# Patient Record
Sex: Female | Born: 1950 | Race: White | Hispanic: No | Marital: Married | Smoking: Never smoker
Health system: Southern US, Community
[De-identification: ages and names within clinical notes are randomized; demographics above are authoritative.]

## PROBLEM LIST (undated history)

## (undated) DIAGNOSIS — N840 Polyp of corpus uteri: Secondary | ICD-10-CM

## (undated) DIAGNOSIS — M8580 Other specified disorders of bone density and structure, unspecified site: Secondary | ICD-10-CM

## (undated) DIAGNOSIS — G43909 Migraine, unspecified, not intractable, without status migrainosus: Secondary | ICD-10-CM

## (undated) DIAGNOSIS — M419 Scoliosis, unspecified: Secondary | ICD-10-CM

## (undated) DIAGNOSIS — C4492 Squamous cell carcinoma of skin, unspecified: Secondary | ICD-10-CM

## (undated) DIAGNOSIS — J302 Other seasonal allergic rhinitis: Secondary | ICD-10-CM

## (undated) DIAGNOSIS — B019 Varicella without complication: Secondary | ICD-10-CM

## (undated) DIAGNOSIS — L570 Actinic keratosis: Secondary | ICD-10-CM

## (undated) DIAGNOSIS — K219 Gastro-esophageal reflux disease without esophagitis: Secondary | ICD-10-CM

## (undated) DIAGNOSIS — T7840XA Allergy, unspecified, initial encounter: Secondary | ICD-10-CM

## (undated) DIAGNOSIS — M1990 Unspecified osteoarthritis, unspecified site: Secondary | ICD-10-CM

## (undated) HISTORY — DX: Actinic keratosis: L57.0

## (undated) HISTORY — DX: Migraine, unspecified, not intractable, without status migrainosus: G43.909

## (undated) HISTORY — PX: COLONOSCOPY: SHX174

## (undated) HISTORY — DX: Unspecified osteoarthritis, unspecified site: M19.90

## (undated) HISTORY — DX: Gastro-esophageal reflux disease without esophagitis: K21.9

## (undated) HISTORY — DX: Other specified disorders of bone density and structure, unspecified site: M85.80

## (undated) HISTORY — DX: Varicella without complication: B01.9

## (undated) HISTORY — DX: Scoliosis, unspecified: M41.9

## (undated) HISTORY — DX: Allergy, unspecified, initial encounter: T78.40XA

## (undated) HISTORY — PX: TOTAL HIP ARTHROPLASTY: SHX124

## (undated) HISTORY — DX: Polyp of corpus uteri: N84.0

## (undated) HISTORY — DX: Other seasonal allergic rhinitis: J30.2

## (undated) HISTORY — DX: Squamous cell carcinoma of skin, unspecified: C44.92

## (undated) HISTORY — PX: OTHER SURGICAL HISTORY: SHX169

---

## 1987-10-21 HISTORY — PX: TUBAL LIGATION: SHX77

## 1999-01-29 ENCOUNTER — Other Ambulatory Visit: Admission: RE | Admit: 1999-01-29 | Discharge: 1999-01-29 | Payer: Self-pay | Admitting: Gynecology

## 2000-02-24 ENCOUNTER — Other Ambulatory Visit: Admission: RE | Admit: 2000-02-24 | Discharge: 2000-02-24 | Payer: Self-pay | Admitting: Gynecology

## 2000-07-21 ENCOUNTER — Ambulatory Visit (HOSPITAL_COMMUNITY): Admission: RE | Admit: 2000-07-21 | Discharge: 2000-07-21 | Payer: Self-pay | Admitting: Gynecology

## 2000-07-21 ENCOUNTER — Encounter: Payer: Self-pay | Admitting: Gynecology

## 2001-03-08 ENCOUNTER — Other Ambulatory Visit: Admission: RE | Admit: 2001-03-08 | Discharge: 2001-03-08 | Payer: Self-pay | Admitting: Gynecology

## 2001-07-22 ENCOUNTER — Encounter: Payer: Self-pay | Admitting: Gynecology

## 2001-07-22 ENCOUNTER — Ambulatory Visit (HOSPITAL_COMMUNITY): Admission: RE | Admit: 2001-07-22 | Discharge: 2001-07-22 | Payer: Self-pay | Admitting: Gynecology

## 2001-12-31 ENCOUNTER — Ambulatory Visit (HOSPITAL_COMMUNITY): Admission: RE | Admit: 2001-12-31 | Discharge: 2001-12-31 | Payer: Self-pay | Admitting: Gastroenterology

## 2002-05-02 ENCOUNTER — Other Ambulatory Visit: Admission: RE | Admit: 2002-05-02 | Discharge: 2002-05-02 | Payer: Self-pay | Admitting: Gynecology

## 2002-08-02 ENCOUNTER — Ambulatory Visit (HOSPITAL_COMMUNITY): Admission: RE | Admit: 2002-08-02 | Discharge: 2002-08-02 | Payer: Self-pay | Admitting: Gynecology

## 2002-08-02 ENCOUNTER — Encounter: Payer: Self-pay | Admitting: Gynecology

## 2003-06-01 ENCOUNTER — Other Ambulatory Visit: Admission: RE | Admit: 2003-06-01 | Discharge: 2003-06-01 | Payer: Self-pay | Admitting: Gynecology

## 2003-08-08 ENCOUNTER — Ambulatory Visit (HOSPITAL_COMMUNITY): Admission: RE | Admit: 2003-08-08 | Discharge: 2003-08-08 | Payer: Self-pay | Admitting: Gynecology

## 2003-08-08 ENCOUNTER — Encounter: Payer: Self-pay | Admitting: Gynecology

## 2004-08-08 ENCOUNTER — Ambulatory Visit (HOSPITAL_COMMUNITY): Admission: RE | Admit: 2004-08-08 | Discharge: 2004-08-08 | Payer: Self-pay | Admitting: Gynecology

## 2004-08-12 ENCOUNTER — Other Ambulatory Visit: Admission: RE | Admit: 2004-08-12 | Discharge: 2004-08-12 | Payer: Self-pay | Admitting: Gynecology

## 2005-08-11 ENCOUNTER — Ambulatory Visit (HOSPITAL_COMMUNITY): Admission: RE | Admit: 2005-08-11 | Discharge: 2005-08-11 | Payer: Self-pay | Admitting: Gynecology

## 2005-08-25 ENCOUNTER — Other Ambulatory Visit: Admission: RE | Admit: 2005-08-25 | Discharge: 2005-08-25 | Payer: Self-pay | Admitting: Gynecology

## 2006-02-10 ENCOUNTER — Ambulatory Visit (HOSPITAL_BASED_OUTPATIENT_CLINIC_OR_DEPARTMENT_OTHER): Admission: RE | Admit: 2006-02-10 | Discharge: 2006-02-10 | Payer: Self-pay | Admitting: Orthopedic Surgery

## 2006-08-13 ENCOUNTER — Ambulatory Visit (HOSPITAL_COMMUNITY): Admission: RE | Admit: 2006-08-13 | Discharge: 2006-08-13 | Payer: Self-pay | Admitting: Gynecology

## 2006-09-18 ENCOUNTER — Ambulatory Visit (HOSPITAL_BASED_OUTPATIENT_CLINIC_OR_DEPARTMENT_OTHER): Admission: RE | Admit: 2006-09-18 | Discharge: 2006-09-18 | Payer: Self-pay | Admitting: Orthopedic Surgery

## 2006-10-29 ENCOUNTER — Other Ambulatory Visit: Admission: RE | Admit: 2006-10-29 | Discharge: 2006-10-29 | Payer: Self-pay | Admitting: Gynecology

## 2007-08-19 ENCOUNTER — Ambulatory Visit (HOSPITAL_COMMUNITY): Admission: RE | Admit: 2007-08-19 | Discharge: 2007-08-19 | Payer: Self-pay | Admitting: Gynecology

## 2007-11-17 ENCOUNTER — Other Ambulatory Visit: Admission: RE | Admit: 2007-11-17 | Discharge: 2007-11-17 | Payer: Self-pay | Admitting: Gynecology

## 2008-08-18 ENCOUNTER — Encounter (INDEPENDENT_AMBULATORY_CARE_PROVIDER_SITE_OTHER): Payer: Self-pay | Admitting: Gynecology

## 2008-08-22 ENCOUNTER — Ambulatory Visit (HOSPITAL_COMMUNITY): Admission: RE | Admit: 2008-08-22 | Discharge: 2008-08-22 | Payer: Self-pay | Admitting: Gynecology

## 2009-06-26 ENCOUNTER — Encounter: Admission: RE | Admit: 2009-06-26 | Discharge: 2009-06-26 | Payer: Self-pay | Admitting: Family Medicine

## 2009-08-23 ENCOUNTER — Ambulatory Visit (HOSPITAL_COMMUNITY): Admission: RE | Admit: 2009-08-23 | Discharge: 2009-08-23 | Payer: Self-pay | Admitting: Gynecology

## 2010-08-14 ENCOUNTER — Ambulatory Visit: Payer: Self-pay | Admitting: Internal Medicine

## 2010-08-14 ENCOUNTER — Encounter: Payer: Self-pay | Admitting: Internal Medicine

## 2010-08-14 DIAGNOSIS — J309 Allergic rhinitis, unspecified: Secondary | ICD-10-CM | POA: Insufficient documentation

## 2010-08-19 LAB — CONVERTED CEMR LAB
ALT: 15 units/L (ref 0–35)
Albumin: 4.2 g/dL (ref 3.5–5.2)
BUN: 17 mg/dL (ref 6–23)
Basophils Relative: 1.6 % (ref 0.0–3.0)
Chloride: 101 meq/L (ref 96–112)
Cholesterol: 178 mg/dL (ref 0–200)
Eosinophils Relative: 2.6 % (ref 0.0–5.0)
HCT: 40.4 % (ref 36.0–46.0)
Lymphs Abs: 1.4 10*3/uL (ref 0.7–4.0)
MCV: 93.4 fL (ref 78.0–100.0)
Monocytes Absolute: 0.4 10*3/uL (ref 0.1–1.0)
Neutro Abs: 2.3 10*3/uL (ref 1.4–7.7)
Platelets: 167 10*3/uL (ref 150.0–400.0)
Potassium: 4 meq/L (ref 3.5–5.1)
TSH: 0.63 microintl units/mL (ref 0.35–5.50)
Total Protein: 7 g/dL (ref 6.0–8.3)
WBC: 4.3 10*3/uL — ABNORMAL LOW (ref 4.5–10.5)

## 2010-08-26 ENCOUNTER — Ambulatory Visit (HOSPITAL_COMMUNITY): Admission: RE | Admit: 2010-08-26 | Discharge: 2010-08-26 | Payer: Self-pay | Admitting: Obstetrics and Gynecology

## 2010-11-21 NOTE — Assessment & Plan Note (Signed)
Summary: new pt requesting cpx/pt will come in fasting/njr   Vital Signs:  Patient profile:   60 year old female Menstrual status:  perimenopausal LMP:     06/03/2010 Height:      65 inches Weight:      123 pounds BMI:     20.54 Pulse rate:   72 / minute BP sitting:   100 / 60  (right arm) Cuff size:   regular  Vitals Entered By: Romualdo Bolk, CMA (AAMA) (August 14, 2010 10:36 AM) CC: New Pt to establish- Pt is fasting for labs. Pt would like a cpx if possible LMP (date): 06/03/2010     Menstrual Status perimenopausal Enter LMP: 06/03/2010 Last PAP Result normal   History of Present Illness: Patricia Potts comes in today  for new patient . she's in need of the PCP and comes in today for a preventive visit. She sees Dr. Thyra Breed , Dr. Amy [place] dermatology for multiple moles. in Dr. Charlann Noss for opthalmology.  she's currently not having active problems. She takes Zyrtec for seasonal allergies. In vitamin D daily she has a history of the slightly decreased bone mass.by dexa  but no fracture  Preventive Care Screening  Pap Smear:    Date:  12/18/2009    Results:  normal   Mammogram:    Date:  07/20/2009    Results:  normal   Colonoscopy:    Date:  10/20/2006    Results:  normal    Preventive Screening-Counseling & Management  Alcohol-Tobacco     Alcohol drinks/day: <1     Alcohol type: wine     Smoking Status: never  Caffeine-Diet-Exercise     Caffeine use/day: 3     Does Patient Exercise: yes     MSH Depression Score: no  Hep-HIV-STD-Contraception     Dental Visit-last 6 months yes     Sun Exposure-Excessive: no  Safety-Violence-Falls     Seat Belt Use: yes     Firearms in the Home: no firearms in the home     Smoke Detectors: yes     Fall Risk: no fall       Drug Use:  no.        Blood Transfusions:  no.    Current Medications (verified): 1)  Zyrtec Allergy 10 Mg Tabs (Cetirizine Hcl) .Marland Kitchen.. 1 By Mouth Once Daily 2)  D 1000 1000  Unit Caps (Cholecalciferol)  Allergies (verified): 1)  ! Sulfa  Past History:  Past Medical History: Allergic rhinitis seasonal  G3 P3 Varicella as a child Dexa mild osteopenia.  Migraines as a teen   young adults. hormonal   .  Actinic keratosis  Past Surgical History: Caesarean section- x 3 1982, [age]  Tubal ligation 89 Mucoid Cysts  removed on both hands fingers : 2007 2008  Sypher.   Past History:  Care Management: Gynecology: [facility] Dermatology: [place] Gastroenterology: [facility]  Family History: Father: CAD- Stroke, Bypasses, HIgh Cholesterol Mother: Colon polyps- but not cancer, squamous cell cancer Siblings: Brother- PE; Sister- hysterectomy for polys Child with Downs Maternal grandparent died of Colon cancer  ? neg  osteoporosis.   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 Smoked Alcohol use-yes- Socially Drug use-no Regular exercise-yes No pets  Smoking Status:  never Caffeine use/day:  3 Does Patient Exercise:  yes Drug Use:  no Seat Belt Use:  yes Dental  Care w/in 6 mos.:  yes Sun Exposure-Excessive:  no Fall Risk:  no fall  Blood Transfusions:  no  Review of Systems  The patient denies anorexia, weight loss, weight gain, vision loss, decreased hearing, hoarseness, chest pain, syncope, dyspnea on exertion, peripheral edema, prolonged cough, headaches, hemoptysis, abdominal pain, melena, hematochezia, severe indigestion/heartburn, hematuria, muscle weakness, transient blindness, unusual weight change, abnormal bleeding, enlarged lymph nodes, angioedema, and breast masses.         past chest x ray done for rooutine check and had   ? copd  vs    abnormal lesion.     after radiology review it was felt to be nothing important. She has no respiratory symptoms.  Physical Exam  General:  Well-developed,well-nourished,in no acute distress; alert,appropriate and cooperative throughout examination Head:   Normocephalic and atraumatic without obvious abnormalities. No apparent alopecia or balding. Eyes:  PERRL, EOMs full, conjunctiva clear  Ears:  R ear normal, L ear normal, and no external deformities.   Nose:  no external deformity and no nasal discharge.   Mouth:  good dentition and pharynx pink and moist.   Neck:  No deformities, masses, or tenderness noted. Chest Wall:  No deformities, masses, or tenderness noted. Breasts:  No mass, nodules, thickening, tenderness, bulging, retraction, inflamation, nipple discharge or skin changes noted.   Lungs:  Normal respiratory effort, chest expands symmetrically. Lungs are clear to auscultation, no crackles or wheezes.no dullness.   Heart:  Normal rate and regular rhythm. S1 and S2 normal without gallop, murmur, click, rub or other extra sounds.no lifts.   Abdomen:  Bowel sounds positive,abdomen soft and non-tender without masses, organomegaly or hernias noted. Genitalia:  per gyne Msk:  no joint swelling, no joint warmth, and no redness over joints.   Pulses:  pulses intact without delay   Extremities:  no clubbing cyanosis or edema  Neurologic:  alert & oriented X3, strength normal in all extremities, gait normal, and DTRs symmetrical and normal.   Skin:  turgor normal and color normal.  sun changes   some scaling.  Cervical Nodes:  No lymphadenopathy noted Axillary Nodes:  No palpable lymphadenopathy Inguinal Nodes:  No significant adenopathy Psych:  Normal eye contact, appropriate affect. Cognition appears normal.  EKG   Impression & Recommendations:  Problem # 1:  PREVENTIVE HEALTH CARE (ICD-V70.0) continue healthy lifestyle .  continue  monitoring wiht family hx of colon cancer  Orders: TLB-BMP (Basic Metabolic Panel-BMET) (80048-METABOL) TLB-CBC Platelet - w/Differential (85025-CBCD) TLB-Hepatic/Liver Function Pnl (80076-HEPATIC) TLB-TSH (Thyroid Stimulating Hormone) (84443-TSH) TLB-Lipid Panel (80061-LIPID) Specimen Handling  (99000) Venipuncture (04540) EKG w/ Interpretation (93000)  Problem # 2:  ALLERGIC RHINITIS (ICD-477.9)  Her updated medication list for this problem includes:    Zyrtec Allergy 10 Mg Tabs (Cetirizine hcl) .Marland Kitchen... 1 by mouth once daily  Complete Medication List: 1)  Zyrtec Allergy 10 Mg Tabs (Cetirizine hcl) .Marland Kitchen.. 1 by mouth once daily 2)  D 1000 1000 Unit Caps (Cholecalciferol)  Patient Instructions: 1)  You will be informed of lab results when available.  2)  call if needed 3)  will try to find old chest x ray and let you know if need repeat.   DG Chest 2 View - STATUS: Final  IMAGE                                     Perform Date: 7 Sep10 09:33  Ordered By:  Laren Boom        Ordered Date: 7 Sep10 09:22  Facility: [facility]                              Department: DG  Service Report Text  GDC Accession Number: [id number]     Addendum Begins    Differential diagnosis for upper lobe scarring and calcified   granuloma in the right upper lobe includes chronic infection such   as histoplasmosis. The findings are relatively mild and are not   likely to be clinically significant. If prior chest x-rays are   available for comparison they would be useful to document   stability.     Addendum Ends    Clinical Data: Physical exam.    CHEST - 2 VIEW    Comparison: None.    Findings: Heart size is normal and there is no heart failure.   Prominent lung markings compatible with chronic lung disease.   Calcified granuloma in the right upper lobe.  Negative for   infiltrate or effusion.  Negative for mass lesion.    IMPRESSION:   Chronic lung disease.  No active cardiopulmonary disease.    Read By:  Camelia Phenes,  M.D.   Released By:  Camelia Phenes,  M.D.  Additional Information  HL7 RESULT STATUS : F  External image : [phone]  External IF Update Timestamp : 2009-07-09:08:50:40.000000  Orders Added: 1)  TLB-BMP (Basic Metabolic Panel-BMET)  [80048-METABOL] 2)  TLB-CBC Platelet - w/Differential [85025-CBCD] 3)  TLB-Hepatic/Liver Function Pnl [80076-HEPATIC] 4)  TLB-TSH (Thyroid Stimulating Hormone) [84443-TSH] 5)  TLB-Lipid Panel [80061-LIPID] 6)  Specimen Handling [99000] 7)  Venipuncture [36415] 8)  New Patient 40-64 years [99386] 9)  EKG w/ Interpretation [93000]

## 2010-11-21 NOTE — Letter (Signed)
Summary: Patient History Form  Patient History Form   Imported By: Maryln Gottron 08/22/2010 13:13:13  _____________________________________________________________________  External Attachment:    Type:   Image     Comment:   External Document

## 2011-03-07 NOTE — Op Note (Signed)
NAMEJADON, Patricia Potts                 ACCOUNT NO.:  [id number]   MEDICAL RECORD NO.:  [id number]          PATIENT TYPE:  AMB   LOCATION:  [facility]                          FACILITY:  [facility]   PHYSICIAN:  Patricia Fitch. Potts, M.D. DATE OF BIRTH:  [birth year]   DATE OF PROCEDURE:  09/18/2006  DATE OF DISCHARGE:                               OPERATIVE REPORT   PREOPERATIVE DIAGNOSIS:  Painful mucoid cyst right long finger distal  interphalangeal joint with underlying osteoarthritis, rule out loose  body.   POSTOPERATIVE DIAGNOSIS:  Painful mucoid cyst right long finger distal  interphalangeal joint with underlying osteoarthritis with identification  of a 3 x 2 mm intra-articular cartilaginous loose body provoking an  effusion leading to a mucoid cyst.   OPERATION:  Exploration of right long finger distal interphalangeal  joint with irrigation and removal of cartilaginous loose body and  debridement of mucoid cyst from dorsal ulnar articular capsule.   SURGEON:  Patricia Fitch. Potts, M.D.   ASSISTANT:  Patricia Potts, Patricia Potts.   ANESTHESIA:  2% lidocaine metacarpal head level block right long finger  supplemented by limited IV sedation.   SUPERVISING ANESTHESIOLOGIST:  Patricia Potts, M.D.   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  She has a history of familial  osteoarthritis.  She had an enlarging mucoid cyst of the left long  finger and ultimately elected to have the cyst debrided.  After  satisfactory resolution of her mucoid cyst predicament on the left, she  presented again in the fall of 2007 with a mucoid cyst on the dorsal  ulnar aspect of her right long finger with associated swelling and  discomfort.  Plain films of the right long finger revealed mild  osteophyte formation at the middle phalangeal head but no obvious loose  bodies were visible on the  x-ray.  Patricia Potts requested debridement of the  right long finger similar to the procedure performed on the left.  After  informed consent, she is brought to the operating room at this time.   Preoperatively, she was advised of our inability to control  osteoarthritis.  It is possible that she will develop similar mucoid  cysts in other fingers.  It is virtually certain that she will develop  other osteophytes over an extended period of time as is the natural  history of hypertrophic osteoarthritis.   PROCEDURE:  Epifania Littrell is brought to the operating room and placed  in the supine position on the operating table.  Following our routine  proper surgical site identification protocol, the right arm was prepped  with Betadine soap solution and sterilely draped.  After light sedation  with IV fentanyl and Versed, the long finger was anesthetized with a 2%  lidocaine metacarpal head level block utilizing a total of 5 mL of 2%  plain lidocaine.  After five minutes, complete anesthesia of the long  finger was achieved.  Patricia Potts long finger was exsanguinated with a gauze  wrap and  a 1/2-inch Penrose drain placed at the proximal phalangeal  segment as a digital tourniquet.   The procedure commenced with palpation of the inspissated cyst looking  for a loose body.  None was identified superficially.  A Mercedes type  incision was fashioned to allow exposure of the extensor tendon,  collateral ligament, and cyst.  With gentle dissection utilizing iris  scissors, an inspissated mucoid cyst was identified between the extensor  tendon and the ulnar collateral ligament.  This was circumferentially  dissected and debrided with a microrongeur.  The deep surface of the  dermis was cleaned to remove all mucoid material.   A capsulotomy was performed by resecting the capsule between the  extensor tendon and the ulnar collateral ligament and a fine elevator  placed beneath the extensor tendon to explore  the PIP joint.  A  microcuret was used to debride the marginal osteophyte on the dorsal  aspect of the distal phalanx and during this maneuver a 3 x 2 mm loose  body extruded.  This was recovered and placed in saline to allow Patricia Potts to visualize the process occurring in her distal interphalangeal  joint.  The wound was then thoroughly irrigated and the joint explored  for other loose bodies.  No other predicaments are identified.   Thereafter, the wound was closed with a corner suture of 5-0 nylon.  The  finger was dressed with Xeroflow sterile gauze and Coban.  Patricia Potts was  awakened from her sedation and transferred to the recovery room with  stable signs.   She is discharged with a prescription for Darvocet N100, 1 p.o. q.4-6h.  p.r.n. pain, 20 tablets without refill, and doxycycline 100 mg p.o.  b.i.d. x4 days as a prophylactic antibiotic.  There were no apparent  complications.      Patricia Potts, M.D.  Electronically Signed     RVS/MEDQ  D:  09/18/2006  T:  09/18/2006  Job:  [id number]   cc:   Carolan Shiver, M.D.

## 2011-03-07 NOTE — Op Note (Signed)
[name], [name]                 ACCOUNT NO.:  [id number]   MEDICAL RECORD NO.:  [id number]          PATIENT TYPE:  AMB   LOCATION:  [facility]                          FACILITY:  [facility]   PHYSICIAN:  Katy Fitch. Sypher, M.D. DATE OF BIRTH:  [birth year]   DATE OF PROCEDURE:  02/10/2006  DATE OF DISCHARGE:                                 OPERATIVE REPORT   PREOPERATIVE DIAGNOSIS:  Inspissated mucoid cyst, left long finger, with  underlying osteoarthrosis of the distal interphalangeal joint.   POSTOPERATIVE DIAGNOSIS:  Inspissated mucoid cyst, left long finger, with  underlying osteoarthrosis of the distal interphalangeal joint.   OPERATION:  Irrigation and curettage/debridement of left long finger distal  interphalangeal joint followed by debridement of mucoid cyst.   SURGEON:  Katy Fitch. Sypher, M.D.   ASSISTANT:  Marveen Reeks. Dasnoit, P.A.-C.   ANESTHESIA:  2% lidocaine metacarpal head level block left long finger  supplemented by IV sedation.   SUPERVISING ANESTHESIOLOGIST:  Sheldon Silvan, M.D.   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 Patricia Potts had minor deformity of her ulnar nail  plate of the left long finger. She had experienced pain and pressure at the  site of her cyst.  She had had episodes of spontaneous drainage.  After  informed consent, she is brought to the operating room at this time  anticipating DIP joint debridement in an effort to relieve the effusion and  cause of the mucoid cyst.   PROCEDURE:  Patricia Potts is brought to the operating room and placed in the  supine position on the operating table. Following light sedation, a 2%  lidocaine metacarpal head level block was placed at the base of the left  long finger.  After five minutes, excellent anesthesia was achieved.  [name]'s left arm was prepped with Betadine soap solution and  sterilely  draped.  Following exsanguination of the left long finger with a gauze wrap,  a 1/4 inch Penrose drain was placed over a gauze padding as a digital  tourniquet.  The procedure commenced with a curvilinear incision exposing  the ulnar dorsal aspect of Patricia Potts's left long finger DIP joint.  The mucoid  cyst was exposed subcutaneously and removed inside out style with a  microrongeur.  With great care, a sinus tract was searched for. I was unable  to clearly identify a sinus tract, however, upon entry into the DIP joint  through a dorsal ulnar arthrotomy between the extensor tendon and the ulnar  collateral ligament, we were able to identify kissing osteophytes on the  adjacent surfaces of the distal and middle phalanges.  A microcuret was used  to debride the osteophyte from the base of the distal phalanx followed by  irrigation of the DIP joint with a blunt needle and sterile saline for a  total of 20 mL. No loose bodies or rice bodies were identified.  The margins  of the mucoid cyst were then debrided with a microronguer followed by  repair  of the skin with a single trauma suture of 5-0 nylon.  A compressive  dressing was applied with Xeroflow, sterile gauze, and Coban.  There were no  apparent complications.   For aftercare, Patricia Potts is provided a prescription for Percocet 5 mg 1 tablet  p.o. q.4-6h. p.r.n. pain, 20 tablets without refill. Also, Keflex 500 mg one  p.o. q.8h. x4 days as prophylactic antibiotic due to joint entry.  We did  provide Patricia Potts 1 gram of Ancef preoperatively as an IV prophylactic  antibiotic.      Katy Fitch Sypher, M.D.  Electronically Signed     RVS/MEDQ  D:  02/10/2006  T:  02/10/2006  Job:  [id number]   cc:   Teena Irani. Arlyce Dice, M.D.  Fax: [phone]   Carolan Shiver, M.D.  Fax: [phone]

## 2011-07-23 ENCOUNTER — Other Ambulatory Visit (HOSPITAL_COMMUNITY): Payer: Self-pay | Admitting: Obstetrics and Gynecology

## 2011-07-23 DIAGNOSIS — Z1231 Encounter for screening mammogram for malignant neoplasm of breast: Secondary | ICD-10-CM

## 2011-08-28 ENCOUNTER — Ambulatory Visit (HOSPITAL_COMMUNITY)
Admission: RE | Admit: 2011-08-28 | Discharge: 2011-08-28 | Disposition: A | Discharge status: Home / Self Care | Payer: BC Managed Care – PPO | Source: Ambulatory Visit | Attending: Obstetrics and Gynecology | Admitting: Obstetrics and Gynecology

## 2011-08-28 DIAGNOSIS — Z1231 Encounter for screening mammogram for malignant neoplasm of breast: Secondary | ICD-10-CM

## 2011-09-04 ENCOUNTER — Other Ambulatory Visit: Payer: Self-pay | Admitting: Obstetrics and Gynecology

## 2011-09-04 DIAGNOSIS — R928 Other abnormal and inconclusive findings on diagnostic imaging of breast: Secondary | ICD-10-CM

## 2011-09-05 ENCOUNTER — Ambulatory Visit
Admission: RE | Admit: 2011-09-05 | Discharge: 2011-09-05 | Disposition: A | Discharge status: Home / Self Care | Payer: BC Managed Care – PPO | Source: Ambulatory Visit | Attending: Obstetrics and Gynecology | Admitting: Obstetrics and Gynecology

## 2011-09-05 DIAGNOSIS — R928 Other abnormal and inconclusive findings on diagnostic imaging of breast: Secondary | ICD-10-CM

## 2012-05-05 ENCOUNTER — Encounter: Payer: Self-pay | Admitting: Internal Medicine

## 2012-08-02 ENCOUNTER — Other Ambulatory Visit (HOSPITAL_COMMUNITY): Payer: Self-pay | Admitting: Obstetrics and Gynecology

## 2012-08-02 DIAGNOSIS — Z1231 Encounter for screening mammogram for malignant neoplasm of breast: Secondary | ICD-10-CM

## 2012-08-20 ENCOUNTER — Other Ambulatory Visit (INDEPENDENT_AMBULATORY_CARE_PROVIDER_SITE_OTHER): Payer: BC Managed Care – PPO

## 2012-08-20 DIAGNOSIS — Z0000 Encounter for general adult medical examination without abnormal findings: Secondary | ICD-10-CM

## 2012-08-20 LAB — CBC WITH DIFFERENTIAL/PLATELET
Basophils Absolute: 0.1 10*3/uL (ref 0.0–0.1)
Basophils Relative: 1.3 % (ref 0.0–3.0)
Eosinophils Absolute: 0.1 10*3/uL (ref 0.0–0.7)
HCT: 40.5 % (ref 36.0–46.0)
Hemoglobin: 13.4 g/dL (ref 12.0–15.0)
Lymphs Abs: 1.5 10*3/uL (ref 0.7–4.0)
MCHC: 33 g/dL (ref 30.0–36.0)
Monocytes Relative: 7.1 % (ref 3.0–12.0)
Neutro Abs: 2.1 10*3/uL (ref 1.4–7.7)
RBC: 4.32 Mil/uL (ref 3.87–5.11)
RDW: 12.4 % (ref 11.5–14.6)

## 2012-08-20 LAB — BASIC METABOLIC PANEL
CO2: 28 mEq/L (ref 19–32)
Glucose, Bld: 80 mg/dL (ref 70–99)
Potassium: 4.6 mEq/L (ref 3.5–5.1)
Sodium: 140 mEq/L (ref 135–145)

## 2012-08-20 LAB — POCT URINALYSIS DIPSTICK
Bilirubin, UA: NEGATIVE
Glucose, UA: NEGATIVE
Ketones, UA: NEGATIVE
Nitrite, UA: NEGATIVE
pH, UA: 6

## 2012-08-20 LAB — LIPID PANEL
HDL: 64.7 mg/dL (ref >39.00)
Total CHOL/HDL Ratio: 3

## 2012-08-20 LAB — HEPATIC FUNCTION PANEL
AST: 21 U/L (ref 0–37)
Albumin: 4.1 g/dL (ref 3.5–5.2)
Total Protein: 7.1 g/dL (ref 6.0–8.3)

## 2012-08-20 LAB — TSH: TSH: 0.76 u[IU]/mL (ref 0.35–5.50)

## 2012-08-27 ENCOUNTER — Encounter: Payer: Self-pay | Admitting: Internal Medicine

## 2012-08-27 ENCOUNTER — Ambulatory Visit (INDEPENDENT_AMBULATORY_CARE_PROVIDER_SITE_OTHER): Payer: BC Managed Care – PPO | Admitting: Internal Medicine

## 2012-08-27 VITALS — BP 112/80 | HR 65 | Temp 98.0°F | Ht 65.0 in | Wt 125.0 lb

## 2012-08-27 DIAGNOSIS — Z0000 Encounter for general adult medical examination without abnormal findings: Secondary | ICD-10-CM

## 2012-08-27 DIAGNOSIS — L570 Actinic keratosis: Secondary | ICD-10-CM

## 2012-08-27 NOTE — Progress Notes (Signed)
Chief Complaint  Patient presents with  . Annual Exam    No pap.  Labs    HPI: Patient comes in today for Preventive Health Care visit   No major change in health status since last visit . But did have uterinepolyp  Removal   For bleeding and no  Problem since then .   Mild djd back.   Dexa   Borderline  Osteopenia  .  Supposed to take vit d but forgets   . Dr [place] sees her every 6 months for skin  surveillance has  bxs no  Cancers noted  Just AKS.   UTD on immuniz hep a and b tdap in 2009  Had flu vaccine last week . utd on colonoscopy . mammo due has benign calcifications right .  Had pap recently   Physically active no program.    ROS:  GEN/ HEENT: No fever, significant weight changes sweats headaches vision problems hearing changes, CV/ PULM; No chest pain shortness of breath cough, syncope,edema  change in exercise tolerance. GI /GU: No adominal pain, vomiting, change in bowel habits. No blood in the stool. No significant GU symptoms. SKIN/HEME: ,no acute skin rashes suspicious lesions or bleeding. No lymphadenopathy, nodules, masses.  NEURO/ PSYCH:  No neurologic signs such as weakness numbness. No depression anxiety. IMM/ Allergy: No unusual infections.  Allergy .   REST of 12 system review negative except as per HPI\ Mom  87 plays golf  Father died  In 41 s Sibs: are  Alive brother had PE.     Study at [employer]  Familial.    Past Medical History  Diagnosis Date  . Seasonal allergies   . Chickenpox   . Actinic keratosis     sees derm 2 x per wyear.  . Osteopenia     Dexa Scan  . Migraine     as teen young adult hormonal  . Uterine polyp     Family History  Problem Relation Age of Onset  . Cancer Mother     Squamous cell cancer  . Coronary artery disease Father     died in 90s  had  pvd   . Hyperlipidemia Father   . Hypertension Father   . Pulmonary embolism Brother     evaluatied at [facility] for hypercoag state   . Cancer Maternal Grandmother     Colon cancer   . Cancer Maternal Grandfather     Colon cancer  . Down syndrome Son     History   Social History  . Marital Status: Married    Spouse Name: N/A    Number of Children: 3  . Years of Education: N/A   Occupational History  . RN     Retired   Social History Main Topics  . Smoking status: Never Smoker   . Smokeless tobacco: None  . Alcohol Use: Yes  . Drug Use: None  . Sexually Active: None   Other Topics Concern  . 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 cetirizine (ZYRTEC) 10 MG tablet Take 10 mg by mouth daily.      . cholecalciferol (VITAMIN D) 1000 UNITS tablet Take 1,000 Units by mouth daily.        EXAM:  BP 112/80  Pulse 65  Temp 98 F (36.7 C) (Oral)  Ht 5\' 5"  (1.651 m)  Wt 125 lb (56.7 kg)  BMI 20.80 kg/m2  SpO2 95%  LMP 08/07/2011  Body mass index is 20.80 kg/(m^2).  Physical Exam: Vital signs reviewed ZOX:WRUE is a well-developed well-nourished alert cooperative   female who appears her stated age in no acute distress.  HEENT: normocephalic atraumatic , Eyes: PERRL EOM's full, conjunctiva clear, Nares: paten,t no deformity discharge or tenderness., Ears: no deformity EAC's clear TMs with normal landmarks. Mouth: clear OP, no lesions, edema.  Moist mucous membranes. Dentition in adequate repair. NECK: supple without masses, thyromegaly or bruits. Breast: normal by inspection . No dimpling, discharge, masses, tenderness or discharge . CHEST/PULM:  Clear to auscultation and percussion breath sounds equal no wheeze , rales or rhonchi. No chest wall deformities or tenderness. CV: PMI is nondisplaced, S1 S2 no gallops, murmurs, rubs. Peripheral pulses are full without delay.No JVD .  ABDOMEN: Bowel sounds normal nontender  No guard or rebound, no hepato splenomegal no CVA  tenderness.  No hernia. Extremtities:  No clubbing cyanosis or edema, no acute joint swelling or redness no focal atrophy NEURO:  Oriented x3, cranial nerves 3-12 appear to be intact, no obvious focal weakness,gait within normal limits no abnormal reflexes or asymmetrical SKIN: No acute rashes normal turgor, color, no bruising or petechiae. Sun changes  PSYCH: Oriented, good eye contact, no obvious depression anxiety, cognition and judgment appear normal. LN: no cervical axillary inguinal adenopathy  Lab Results  Component Value Date   WBC 4.0* 08/20/2012   HGB 13.4 08/20/2012   HCT 40.5 08/20/2012   PLT 177.0 08/20/2012   GLUCOSE 80 08/20/2012   CHOL 178 08/20/2012   TRIG 47.0 08/20/2012   HDL 64.70 08/20/2012   LDLCALC 104* 08/20/2012   ALT 15 08/20/2012   AST 21 08/20/2012   NA 140 08/20/2012   K 4.6 08/20/2012   CL 105 08/20/2012   CREATININE 0.8 08/20/2012   BUN 23 08/20/2012   CO2 28 08/20/2012   TSH 0.76 08/20/2012    ASSESSMENT AND PLAN:  Discussed the following assessment and plan:  1. Visit for preventive health examination    utd can get zostavax   after checking insurance reimburs  can go to every 2 years if continueing other surveillance  2. Actinic keratosis   HO on calcium vit d in diet.    Patient Instructions  Continue lifestyle intervention healthy eating and exercise . Eight bearing exercise for bone health. Adequate sleep. Mediterranean type diet  Appears the be the healthiest. Assess caclium in diet . May need to take vit d supplement if not drinking milk.  Preventive visit in 2 years  But check on zostavax  And can get in  The interim.   Preventive Care for Adults, Female A healthy lifestyle and preventive care can promote health and wellness. Preventive health guidelines for women include the following key practices.  A routine yearly physical is a good way to check with your caregiver about your health and preventive screening. It is a chance to share any  concerns and updates on your health, and to receive a thorough exam.  Visit your dentist for a routine exam and preventive care every 6 months. Brush your teeth twice a day and floss once a day. Good oral hygiene prevents tooth decay and gum disease.  The frequency of eye exams is based on your age, health, family medical history, use of contact lenses, and other factors. Follow your caregiver's recommendations for frequency of eye  exams.  Eat a healthy diet. Foods like vegetables, fruits, whole grains, low-fat dairy products, and lean protein foods contain the nutrients you need without too many calories. Decrease your intake of foods high in solid fats, added sugars, and salt. Eat the right amount of calories for you.Get information about a proper diet from your caregiver, if necessary.  Regular physical exercise is one of the most important things you can do for your health. Most adults should get at least 150 minutes of moderate-intensity exercise (any activity that increases your heart rate and causes you to sweat) each week. In addition, most adults need muscle-strengthening exercises on 2 or more days a week.  Maintain a healthy weight. The body mass index (BMI) is a screening tool to identify possible weight problems. It provides an estimate of body fat based on height and weight. Your caregiver can help determine your BMI, and can help you achieve or maintain a healthy weight.For adults 20 years and older:  A BMI below 18.5 is considered underweight.  A BMI of 18.5 to 24.9 is normal.  A BMI of 25 to 29.9 is considered overweight.  A BMI of 30 and above is considered obese.  Maintain normal blood lipids and cholesterol levels by exercising and minimizing your intake of saturated fat. Eat a balanced diet with plenty of fruit and vegetables. Blood tests for lipids and cholesterol should begin at age 3 and be repeated every 5 years. If your lipid or cholesterol levels are high, you are  over 50, or you are at high risk for heart disease, you may need your cholesterol levels checked more frequently.Ongoing high lipid and cholesterol levels should be treated with medicines if diet and exercise are not effective.  If you smoke, find out from your caregiver how to quit. If you do not use tobacco, do not start.  If you are pregnant, do not drink alcohol. If you are breastfeeding, be very cautious about drinking alcohol. If you are not pregnant and choose to drink alcohol, do not exceed 1 drink per day. One drink is considered to be 12 ounces (355 mL) of beer, 5 ounces (148 mL) of wine, or 1.5 ounces (44 mL) of liquor.  Avoid use of street drugs. Do not share needles with anyone. Ask for help if you need support or instructions about stopping the use of drugs.  High blood pressure causes heart disease and increases the risk of stroke. Your blood pressure should be checked at least every 1 to 2 years. Ongoing high blood pressure should be treated with medicines if weight loss and exercise are not effective.  If you are 93 to 60 years old, ask your caregiver if you should take aspirin to prevent strokes.  Diabetes screening involves taking a blood sample to check your fasting blood sugar level. This should be done once every 3 years, after age 73, if you are within normal weight and without risk factors for diabetes. Testing should be considered at a younger age or be carried out more frequently if you are overweight and have at least 1 risk factor for diabetes.  Breast cancer screening is essential preventive care for women. You should practice "breast self-awareness." This means understanding the normal appearance and feel of your breasts and may include breast self-examination. Any changes detected, no matter how small, should be reported to a caregiver. Women in their 37s and 30s should have a clinical breast exam (CBE) by a caregiver as part of a regular health  exam every 1 to 3 years.  After age 58, women should have a CBE every year. Starting at age 53, women should consider having a mammography (breast X-ray test) every year. Women who have a family history of breast cancer should talk to their caregiver about genetic screening. Women at a high risk of breast cancer should talk to their caregivers about having magnetic resonance imaging (MRI) and a mammography every year.  The Pap test is a screening test for cervical cancer. A Pap test can show cell changes on the cervix that might become cervical cancer if left untreated. A Pap test is a procedure in which cells are obtained and examined from the lower end of the uterus (cervix).  Women should have a Pap test starting at age 37.  Between ages 11 and 53, Pap tests should be repeated every 2 years.  Beginning at age 83, you should have a Pap test every 3 years as long as the past 3 Pap tests have been normal.  Some women have medical problems that increase the chance of getting cervical cancer. Talk to your caregiver about these problems. It is especially important to talk to your caregiver if a new problem develops soon after your last Pap test. In these cases, your caregiver may recommend more frequent screening and Pap tests.  The above recommendations are the same for women who have or have not gotten the vaccine for human papillomavirus (HPV).  If you had a hysterectomy for a problem that was not cancer or a condition that could lead to cancer, then you no longer need Pap tests. Even if you no longer need a Pap test, a regular exam is a good idea to make sure no other problems are starting.  If you are between ages 54 and 9, and you have had normal Pap tests going back 10 years, you no longer need Pap tests. Even if you no longer need a Pap test, a regular exam is a good idea to make sure no other problems are starting.  If you have had past treatment for cervical cancer or a condition that could lead to cancer, you need  Pap tests and screening for cancer for at least 20 years after your treatment.  If Pap tests have been discontinued, risk factors (such as a new sexual partner) need to be reassessed to determine if screening should be resumed.  The HPV test is an additional test that may be used for cervical cancer screening. The HPV test looks for the virus that can cause the cell changes on the cervix. The cells collected during the Pap test can be tested for HPV. The HPV test could be used to screen women aged 24 years and older, and should be used in women of any age who have unclear Pap test results. After the age of 49, women should have HPV testing at the same frequency as a Pap test.  Colorectal cancer can be detected and often prevented. Most routine colorectal cancer screening begins at the age of 39 and continues through age 77. However, your caregiver may recommend screening at an earlier age if you have risk factors for colon cancer. On a yearly basis, your caregiver may provide home test kits to check for hidden blood in the stool. Use of a small camera at the end of a tube, to directly examine the colon (sigmoidoscopy or colonoscopy), can detect the earliest forms of colorectal cancer. Talk to your caregiver about this at age 76,  when routine screening begins. Direct examination of the colon should be repeated every 5 to 10 years through age 47, unless early forms of pre-cancerous polyps or small growths are found.  Hepatitis C blood testing is recommended for all people born from [birth year] through 1965 and any individual with known risks for hepatitis C.  Practice safe sex. Use condoms and avoid high-risk sexual practices to reduce the spread of sexually transmitted infections (STIs). STIs include gonorrhea, chlamydia, syphilis, trichomonas, herpes, HPV, and human immunodeficiency virus (HIV). Herpes, HIV, and HPV are viral illnesses that have no cure. They can result in disability, cancer, and death. Sexually  active women aged 67 and younger should be checked for chlamydia. Older women with new or multiple partners should also be tested for chlamydia. Testing for other STIs is recommended if you are sexually active and at increased risk.  Osteoporosis is a disease in which the bones lose minerals and strength with aging. This can result in serious bone fractures. The risk of osteoporosis can be identified using a bone density scan. Women ages 73 and over and women at risk for fractures or osteoporosis should discuss screening with their caregivers. Ask your caregiver whether you should take a calcium supplement or vitamin D to reduce the rate of osteoporosis.  Menopause can be associated with physical symptoms and risks. Hormone replacement therapy is available to decrease symptoms and risks. You should talk to your caregiver about whether hormone replacement therapy is right for you.  Use sunscreen with sun protection factor (SPF) of 30 or more. Apply sunscreen liberally and repeatedly throughout the day. You should seek shade when your shadow is shorter than you. Protect yourself by wearing long sleeves, pants, a wide-brimmed hat, and sunglasses year round, whenever you are outdoors.  Once a month, do a whole body skin exam, using a mirror to look at the skin on your back. Notify your caregiver of new moles, moles that have irregular borders, moles that are larger than a pencil eraser, or moles that have changed in shape or color.  Stay current with required immunizations.  Influenza. You need a dose every fall (or winter). The composition of the flu vaccine changes each year, so being vaccinated once is not enough.  Pneumococcal polysaccharide. You need 1 to 2 doses if you smoke cigarettes or if you have certain chronic medical conditions. You need 1 dose at age 55 (or older) if you have never been vaccinated.  Tetanus, diphtheria, pertussis (Tdap, Td). Get 1 dose of Tdap vaccine if you are younger  than age 57, are over 44 and have contact with an infant, are a Research scientist (physical sciences), are pregnant, or simply want to be protected from whooping cough. After that, you need a Td booster dose every 10 years. Consult your caregiver if you have not had at least 3 tetanus and diphtheria-containing shots sometime in your life or have a deep or dirty wound.  HPV. You need this vaccine if you are a woman age 33 or younger. The vaccine is given in 3 doses over 6 months.  Measles, mumps, rubella (MMR). You need at least 1 dose of MMR if you were born in 1957 or later. You may also need a second dose.  Meningococcal. If you are age 63 to 82 and a first-year college student living in a residence hall, or have one of several medical conditions, you need to get vaccinated against meningococcal disease. You may also need additional booster doses.  Zoster (shingles). If  you are age 32 or older, you should get this vaccine.  Varicella (chickenpox). If you have never had chickenpox or you were vaccinated but received only 1 dose, talk to your caregiver to find out if you need this vaccine.  Hepatitis A. You need this vaccine if you have a specific risk factor for hepatitis A virus infection or you simply wish to be protected from this disease. The vaccine is usually given as 2 doses, 6 to 18 months apart.  Hepatitis B. You need this vaccine if you have a specific risk factor for hepatitis B virus infection or you simply wish to be protected from this disease. The vaccine is given in 3 doses, usually over 6 months. Preventive Services / Frequency   Ages 37 to 32  Blood pressure check.** / Every 1 to 2 years.  Lipid and cholesterol check.** / Every 5 years beginning at age 48.  Clinical breast exam.** / Every year after age 31.  Mammogram.** / Every year beginning at age 59 and continuing for as long as you are in good health. Consult with your caregiver.  Pap test.** / Every 3 years starting at age 48 through  age 65 or 36 with a history of 3 consecutive normal Pap tests.  HPV screening.** / Every 3 years from ages 27 through ages 94 to 44 with a history of 3 consecutive normal Pap tests.  Fecal occult blood test (FOBT) of stool. / Every year beginning at age 67 and continuing until age 62. You may not need to do this test if you get a colonoscopy every 10 years.  Flexible sigmoidoscopy or colonoscopy.** / Every 5 years for a flexible sigmoidoscopy or every 10 years for a colonoscopy beginning at age 30 and continuing until age 54.  Hepatitis C blood test.** / For all people born from [birth year] through 1965 and any individual with known risks for hepatitis C.  Skin self-exam. / Monthly.  Influenza immunization.** / Every year.  Pneumococcal polysaccharide immunization.** / 1 to 2 doses if you smoke cigarettes or if you have certain chronic medical conditions.  Tetanus, diphtheria, pertussis (Tdap, Td) immunization.** / A one-time dose of Tdap vaccine. After that, you need a Td booster dose every 10 years.  Measles, mumps, rubella (MMR) immunization. / You need at least 1 dose of MMR if you were born in 1957 or later. You may also need a second dose.  Varicella immunization.** / Consult your caregiver.  Meningococcal immunization.** / Consult your caregiver.  Hepatitis A immunization.** / Consult your caregiver. 2 doses, 6 to 18 months apart.  Hepatitis B immunization.** / Consult your caregiver. 3 doses, usually over 6 months. Ages 77 and over  Blood pressure check.** / Every 1 to 2 years.  Lipid and cholesterol check.** / Every 5 years beginning at age 58.  Clinical breast exam.** / Every year after age 79.  Mammogram.** / Every year beginning at age 37 and continuing for as long as you are in good health. Consult with your caregiver.  Pap test.** / Every 3 years starting at age 62 through age 17 or 90 with a 3 consecutive normal Pap tests. Testing can be stopped between 65 and 70 with 3  consecutive normal Pap tests and no abnormal Pap or HPV tests in the past 10 years.  HPV screening.** / Every 3 years from ages 75 through ages 71 or 28 with a history of 3 consecutive normal Pap tests. Testing can be stopped between 65 and  70 with 3 consecutive normal Pap tests and no abnormal Pap or HPV tests in the past 10 years.  Fecal occult blood test (FOBT) of stool. / Every year beginning at age 7 and continuing until age 43. You may not need to do this test if you get a colonoscopy every 10 years.  Flexible sigmoidoscopy or colonoscopy.** / Every 5 years for a flexible sigmoidoscopy or every 10 years for a colonoscopy beginning at age 90 and continuing until age 26.  Hepatitis C blood test.** / For all people born from [birth year] through 1964 and any individual with known risks for hepatitis C.  Osteoporosis screening.** / A one-time screening for women ages 22 and over and women at risk for fractures or osteoporosis.  Skin self-exam. / Monthly.  Influenza immunization.** / Every year.  Pneumococcal polysaccharide immunization.** / 1 dose at age 61 (or older) if you have never been vaccinated.  Tetanus, diphtheria, pertussis (Tdap, Td) immunization. / A one-time dose of Tdap vaccine if you are over 65 and have contact with an infant, are a Research scientist (physical sciences), or simply want to be protected from whooping cough. After that, you need a Td booster dose every 10 years.  Varicella immunization.** / Consult your caregiver.  Meningococcal immunization.** / Consult your caregiver.  Hepatitis A immunization.** / Consult your caregiver. 2 doses, 6 to 18 months apart.  Hepatitis B immunization.** / Check with your caregiver. 3 doses, usually over 6 months. ** Family history and personal history of risk and conditions may change your caregiver's recommendations. Document Released: 12/02/2001 Document Revised: 12/29/2011 Document Reviewed: 03/03/2011 [facility] Patient Information 2013 [place],  [place].       Lorretta Harp

## 2012-08-27 NOTE — Patient Instructions (Signed)
Continue lifestyle intervention healthy eating and exercise . Eight bearing exercise for bone health. Adequate sleep. Mediterranean type diet  Appears the be the healthiest. Assess caclium in diet . May need to take vit d supplement if not drinking milk.  Preventive visit in 2 years  But check on zostavax  And can get in  The interim.   Preventive Care for Adults, Female A healthy lifestyle and preventive care can promote health and wellness. Preventive health guidelines for women include the following key practices.  A routine yearly physical is a good way to check with your caregiver about your health and preventive screening. It is a chance to share any concerns and updates on your health, and to receive a thorough exam.  Visit your dentist for a routine exam and preventive care every 6 months. Brush your teeth twice a day and floss once a day. Good oral hygiene prevents tooth decay and gum disease.  The frequency of eye exams is based on your age, health, family medical history, use of contact lenses, and other factors. Follow your caregiver's recommendations for frequency of eye exams.  Eat a healthy diet. Foods like vegetables, fruits, whole grains, low-fat dairy products, and lean protein foods contain the nutrients you need without too many calories. Decrease your intake of foods high in solid fats, added sugars, and salt. Eat the right amount of calories for you.Get information about a proper diet from your caregiver, if necessary.  Regular physical exercise is one of the most important things you can do for your health. Most adults should get at least 150 minutes of moderate-intensity exercise (any activity that increases your heart rate and causes you to sweat) each week. In addition, most adults need muscle-strengthening exercises on 2 or more days a week.  Maintain a healthy weight. The body mass index (BMI) is a screening tool to identify possible weight problems. It provides an  estimate of body fat based on height and weight. Your caregiver can help determine your BMI, and can help you achieve or maintain a healthy weight.For adults 20 years and older:  A BMI below 18.5 is considered underweight.  A BMI of 18.5 to 24.9 is normal.  A BMI of 25 to 29.9 is considered overweight.  A BMI of 30 and above is considered obese.  Maintain normal blood lipids and cholesterol levels by exercising and minimizing your intake of saturated fat. Eat a balanced diet with plenty of fruit and vegetables. Blood tests for lipids and cholesterol should begin at age 43 and be repeated every 5 years. If your lipid or cholesterol levels are high, you are over 50, or you are at high risk for heart disease, you may need your cholesterol levels checked more frequently.Ongoing high lipid and cholesterol levels should be treated with medicines if diet and exercise are not effective.  If you smoke, find out from your caregiver how to quit. If you do not use tobacco, do not start.  If you are pregnant, do not drink alcohol. If you are breastfeeding, be very cautious about drinking alcohol. If you are not pregnant and choose to drink alcohol, do not exceed 1 drink per day. One drink is considered to be 12 ounces (355 mL) of beer, 5 ounces (148 mL) of wine, or 1.5 ounces (44 mL) of liquor.  Avoid use of street drugs. Do not share needles with anyone. Ask for help if you need support or instructions about stopping the use of drugs.  High blood  pressure causes heart disease and increases the risk of stroke. Your blood pressure should be checked at least every 1 to 2 years. Ongoing high blood pressure should be treated with medicines if weight loss and exercise are not effective.  If you are 37 to 60 years old, ask your caregiver if you should take aspirin to prevent strokes.  Diabetes screening involves taking a blood sample to check your fasting blood sugar level. This should be done once every 3  years, after age 61, if you are within normal weight and without risk factors for diabetes. Testing should be considered at a younger age or be carried out more frequently if you are overweight and have at least 1 risk factor for diabetes.  Breast cancer screening is essential preventive care for women. You should practice "breast self-awareness." This means understanding the normal appearance and feel of your breasts and may include breast self-examination. Any changes detected, no matter how small, should be reported to a caregiver. Women in their 18s and 30s should have a clinical breast exam (CBE) by a caregiver as part of a regular health exam every 1 to 3 years. After age 23, women should have a CBE every year. Starting at age 64, women should consider having a mammography (breast X-ray test) every year. Women who have a family history of breast cancer should talk to their caregiver about genetic screening. Women at a high risk of breast cancer should talk to their caregivers about having magnetic resonance imaging (MRI) and a mammography every year.  The Pap test is a screening test for cervical cancer. A Pap test can show cell changes on the cervix that might become cervical cancer if left untreated. A Pap test is a procedure in which cells are obtained and examined from the lower end of the uterus (cervix).  Women should have a Pap test starting at age 31.  Between ages 76 and 6, Pap tests should be repeated every 2 years.  Beginning at age 57, you should have a Pap test every 3 years as long as the past 3 Pap tests have been normal.  Some women have medical problems that increase the chance of getting cervical cancer. Talk to your caregiver about these problems. It is especially important to talk to your caregiver if a new problem develops soon after your last Pap test. In these cases, your caregiver may recommend more frequent screening and Pap tests.  The above recommendations are the same  for women who have or have not gotten the vaccine for human papillomavirus (HPV).  If you had a hysterectomy for a problem that was not cancer or a condition that could lead to cancer, then you no longer need Pap tests. Even if you no longer need a Pap test, a regular exam is a good idea to make sure no other problems are starting.  If you are between ages 36 and 61, and you have had normal Pap tests going back 10 years, you no longer need Pap tests. Even if you no longer need a Pap test, a regular exam is a good idea to make sure no other problems are starting.  If you have had past treatment for cervical cancer or a condition that could lead to cancer, you need Pap tests and screening for cancer for at least 20 years after your treatment.  If Pap tests have been discontinued, risk factors (such as a new sexual partner) need to be reassessed to determine if screening should  be resumed.  The HPV test is an additional test that may be used for cervical cancer screening. The HPV test looks for the virus that can cause the cell changes on the cervix. The cells collected during the Pap test can be tested for HPV. The HPV test could be used to screen women aged 63 years and older, and should be used in women of any age who have unclear Pap test results. After the age of 59, women should have HPV testing at the same frequency as a Pap test.  Colorectal cancer can be detected and often prevented. Most routine colorectal cancer screening begins at the age of 76 and continues through age 78. However, your caregiver may recommend screening at an earlier age if you have risk factors for colon cancer. On a yearly basis, your caregiver may provide home test kits to check for hidden blood in the stool. Use of a small camera at the end of a tube, to directly examine the colon (sigmoidoscopy or colonoscopy), can detect the earliest forms of colorectal cancer. Talk to your caregiver about this at age 75, when routine  screening begins. Direct examination of the colon should be repeated every 5 to 10 years through age 61, unless early forms of pre-cancerous polyps or small growths are found.  Hepatitis C blood testing is recommended for all people born from [birth year] through 1965 and any individual with known risks for hepatitis C.  Practice safe sex. Use condoms and avoid high-risk sexual practices to reduce the spread of sexually transmitted infections (STIs). STIs include gonorrhea, chlamydia, syphilis, trichomonas, herpes, HPV, and human immunodeficiency virus (HIV). Herpes, HIV, and HPV are viral illnesses that have no cure. They can result in disability, cancer, and death. Sexually active women aged 70 and younger should be checked for chlamydia. Older women with new or multiple partners should also be tested for chlamydia. Testing for other STIs is recommended if you are sexually active and at increased risk.  Osteoporosis is a disease in which the bones lose minerals and strength with aging. This can result in serious bone fractures. The risk of osteoporosis can be identified using a bone density scan. Women ages 67 and over and women at risk for fractures or osteoporosis should discuss screening with their caregivers. Ask your caregiver whether you should take a calcium supplement or vitamin D to reduce the rate of osteoporosis.  Menopause can be associated with physical symptoms and risks. Hormone replacement therapy is available to decrease symptoms and risks. You should talk to your caregiver about whether hormone replacement therapy is right for you.  Use sunscreen with sun protection factor (SPF) of 30 or more. Apply sunscreen liberally and repeatedly throughout the day. You should seek shade when your shadow is shorter than you. Protect yourself by wearing long sleeves, pants, a wide-brimmed hat, and sunglasses year round, whenever you are outdoors.  Once a month, do a whole body skin exam, using a mirror to  look at the skin on your back. Notify your caregiver of new moles, moles that have irregular borders, moles that are larger than a pencil eraser, or moles that have changed in shape or color.  Stay current with required immunizations.  Influenza. You need a dose every fall (or winter). The composition of the flu vaccine changes each year, so being vaccinated once is not enough.  Pneumococcal polysaccharide. You need 1 to 2 doses if you smoke cigarettes or if you have certain chronic medical conditions. You need  1 dose at age 84 (or older) if you have never been vaccinated.  Tetanus, diphtheria, pertussis (Tdap, Td). Get 1 dose of Tdap vaccine if you are younger than age 24, are over 83 and have contact with an infant, are a Research scientist (physical sciences), are pregnant, or simply want to be protected from whooping cough. After that, you need a Td booster dose every 10 years. Consult your caregiver if you have not had at least 3 tetanus and diphtheria-containing shots sometime in your life or have a deep or dirty wound.  HPV. You need this vaccine if you are a woman age 12 or younger. The vaccine is given in 3 doses over 6 months.  Measles, mumps, rubella (MMR). You need at least 1 dose of MMR if you were born in 1957 or later. You may also need a second dose.  Meningococcal. If you are age 8 to 33 and a first-year college student living in a residence hall, or have one of several medical conditions, you need to get vaccinated against meningococcal disease. You may also need additional booster doses.  Zoster (shingles). If you are age 57 or older, you should get this vaccine.  Varicella (chickenpox). If you have never had chickenpox or you were vaccinated but received only 1 dose, talk to your caregiver to find out if you need this vaccine.  Hepatitis A. You need this vaccine if you have a specific risk factor for hepatitis A virus infection or you simply wish to be protected from this disease. The vaccine is  usually given as 2 doses, 6 to 18 months apart.  Hepatitis B. You need this vaccine if you have a specific risk factor for hepatitis B virus infection or you simply wish to be protected from this disease. The vaccine is given in 3 doses, usually over 6 months. Preventive Services / Frequency   Ages 77 to 76  Blood pressure check.** / Every 1 to 2 years.  Lipid and cholesterol check.** / Every 5 years beginning at age 41.  Clinical breast exam.** / Every year after age 33.  Mammogram.** / Every year beginning at age 58 and continuing for as long as you are in good health. Consult with your caregiver.  Pap test.** / Every 3 years starting at age 65 through age 37 or 40 with a history of 3 consecutive normal Pap tests.  HPV screening.** / Every 3 years from ages 20 through ages 78 to 49 with a history of 3 consecutive normal Pap tests.  Fecal occult blood test (FOBT) of stool. / Every year beginning at age 16 and continuing until age 42. You may not need to do this test if you get a colonoscopy every 10 years.  Flexible sigmoidoscopy or colonoscopy.** / Every 5 years for a flexible sigmoidoscopy or every 10 years for a colonoscopy beginning at age 53 and continuing until age 48.  Hepatitis C blood test.** / For all people born from [birth year] through 1965 and any individual with known risks for hepatitis C.  Skin self-exam. / Monthly.  Influenza immunization.** / Every year.  Pneumococcal polysaccharide immunization.** / 1 to 2 doses if you smoke cigarettes or if you have certain chronic medical conditions.  Tetanus, diphtheria, pertussis (Tdap, Td) immunization.** / A one-time dose of Tdap vaccine. After that, you need a Td booster dose every 10 years.  Measles, mumps, rubella (MMR) immunization. / You need at least 1 dose of MMR if you were born in 1957 or later. You  may also need a second dose.  Varicella immunization.** / Consult your caregiver.  Meningococcal immunization.** /  Consult your caregiver.  Hepatitis A immunization.** / Consult your caregiver. 2 doses, 6 to 18 months apart.  Hepatitis B immunization.** / Consult your caregiver. 3 doses, usually over 6 months. Ages 83 and over  Blood pressure check.** / Every 1 to 2 years.  Lipid and cholesterol check.** / Every 5 years beginning at age 33.  Clinical breast exam.** / Every year after age 19.  Mammogram.** / Every year beginning at age 23 and continuing for as long as you are in good health. Consult with your caregiver.  Pap test.** / Every 3 years starting at age 66 through age 45 or 38 with a 3 consecutive normal Pap tests. Testing can be stopped between 65 and 70 with 3 consecutive normal Pap tests and no abnormal Pap or HPV tests in the past 10 years.  HPV screening.** / Every 3 years from ages 37 through ages 28 or 16 with a history of 3 consecutive normal Pap tests. Testing can be stopped between 65 and 70 with 3 consecutive normal Pap tests and no abnormal Pap or HPV tests in the past 10 years.  Fecal occult blood test (FOBT) of stool. / Every year beginning at age 65 and continuing until age 70. You may not need to do this test if you get a colonoscopy every 10 years.  Flexible sigmoidoscopy or colonoscopy.** / Every 5 years for a flexible sigmoidoscopy or every 10 years for a colonoscopy beginning at age 33 and continuing until age 53.  Hepatitis C blood test.** / For all people born from [birth year] through 1965 and any individual with known risks for hepatitis C.  Osteoporosis screening.** / A one-time screening for women ages 60 and over and women at risk for fractures or osteoporosis.  Skin self-exam. / Monthly.  Influenza immunization.** / Every year.  Pneumococcal polysaccharide immunization.** / 1 dose at age 19 (or older) if you have never been vaccinated.  Tetanus, diphtheria, pertussis (Tdap, Td) immunization. / A one-time dose of Tdap vaccine if you are over 65 and have contact with  an infant, are a Research scientist (physical sciences), or simply want to be protected from whooping cough. After that, you need a Td booster dose every 10 years.  Varicella immunization.** / Consult your caregiver.  Meningococcal immunization.** / Consult your caregiver.  Hepatitis A immunization.** / Consult your caregiver. 2 doses, 6 to 18 months apart.  Hepatitis B immunization.** / Check with your caregiver. 3 doses, usually over 6 months. ** Family history and personal history of risk and conditions may change your caregiver's recommendations. Document Released: 12/02/2001 Document Revised: 12/29/2011 Document Reviewed: 03/03/2011 [facility] Patient Information 2013 [place], [place].

## 2012-09-02 ENCOUNTER — Encounter: Payer: Self-pay | Admitting: Internal Medicine

## 2012-09-06 ENCOUNTER — Ambulatory Visit (HOSPITAL_COMMUNITY)
Admission: RE | Admit: 2012-09-06 | Discharge: 2012-09-06 | Disposition: A | Discharge status: Home / Self Care | Payer: BC Managed Care – PPO | Source: Ambulatory Visit | Attending: Obstetrics and Gynecology | Admitting: Obstetrics and Gynecology

## 2012-09-06 DIAGNOSIS — Z1231 Encounter for screening mammogram for malignant neoplasm of breast: Secondary | ICD-10-CM

## 2013-04-08 ENCOUNTER — Ambulatory Visit (INDEPENDENT_AMBULATORY_CARE_PROVIDER_SITE_OTHER): Payer: BC Managed Care – PPO | Admitting: Family Medicine

## 2013-04-08 DIAGNOSIS — Z2911 Encounter for prophylactic immunotherapy for respiratory syncytial virus (RSV): Secondary | ICD-10-CM

## 2013-04-08 DIAGNOSIS — Z23 Encounter for immunization: Secondary | ICD-10-CM

## 2013-08-11 ENCOUNTER — Other Ambulatory Visit (HOSPITAL_COMMUNITY): Payer: Self-pay | Admitting: Obstetrics and Gynecology

## 2013-08-11 DIAGNOSIS — Z1231 Encounter for screening mammogram for malignant neoplasm of breast: Secondary | ICD-10-CM

## 2013-09-07 ENCOUNTER — Ambulatory Visit (HOSPITAL_COMMUNITY)
Admission: RE | Admit: 2013-09-07 | Discharge: 2013-09-07 | Disposition: A | Discharge status: Home / Self Care | Payer: BC Managed Care – PPO | Source: Ambulatory Visit | Attending: Obstetrics and Gynecology | Admitting: Obstetrics and Gynecology

## 2013-09-07 DIAGNOSIS — Z1231 Encounter for screening mammogram for malignant neoplasm of breast: Secondary | ICD-10-CM | POA: Insufficient documentation

## 2014-04-07 ENCOUNTER — Other Ambulatory Visit: Payer: Self-pay | Admitting: Orthopedic Surgery

## 2014-04-07 DIAGNOSIS — M25511 Pain in right shoulder: Secondary | ICD-10-CM

## 2014-04-14 ENCOUNTER — Ambulatory Visit
Admission: RE | Admit: 2014-04-14 | Discharge: 2014-04-14 | Disposition: A | Discharge status: Home / Self Care | Payer: BC Managed Care – PPO | Source: Ambulatory Visit | Attending: Orthopedic Surgery | Admitting: Orthopedic Surgery

## 2014-04-14 DIAGNOSIS — M25511 Pain in right shoulder: Secondary | ICD-10-CM

## 2014-08-02 ENCOUNTER — Other Ambulatory Visit (HOSPITAL_COMMUNITY): Payer: Self-pay | Admitting: Obstetrics and Gynecology

## 2014-08-02 DIAGNOSIS — Z1231 Encounter for screening mammogram for malignant neoplasm of breast: Secondary | ICD-10-CM

## 2014-08-21 ENCOUNTER — Encounter: Payer: Self-pay | Admitting: Internal Medicine

## 2014-09-18 ENCOUNTER — Ambulatory Visit (HOSPITAL_COMMUNITY): Payer: BC Managed Care – PPO

## 2014-09-18 ENCOUNTER — Ambulatory Visit (HOSPITAL_COMMUNITY)
Admission: RE | Admit: 2014-09-18 | Discharge: 2014-09-18 | Disposition: A | Discharge status: Home / Self Care | Payer: BC Managed Care – PPO | Source: Ambulatory Visit | Attending: Obstetrics and Gynecology | Admitting: Obstetrics and Gynecology

## 2014-09-18 DIAGNOSIS — Z1231 Encounter for screening mammogram for malignant neoplasm of breast: Secondary | ICD-10-CM | POA: Diagnosis not present

## 2015-01-15 ENCOUNTER — Telehealth: Payer: Self-pay | Admitting: Internal Medicine

## 2015-01-15 NOTE — Telephone Encounter (Signed)
Pt's mother in law has moved here from [place]. She is a healthy 64 y/o with [employer]. They would like for you to to be her PCP. Please advise.

## 2015-01-18 NOTE — Telephone Encounter (Signed)
Pt called today and would like to know if Dr Regis Bill is going to except her mother

## 2015-01-19 NOTE — Telephone Encounter (Signed)
Noted  Would have been a long wait otherwise

## 2015-01-19 NOTE — Telephone Encounter (Signed)
Pt called back today and has been able to make other arrangements

## 2015-03-26 ENCOUNTER — Encounter: Payer: Self-pay | Admitting: Internal Medicine

## 2015-03-26 ENCOUNTER — Ambulatory Visit (INDEPENDENT_AMBULATORY_CARE_PROVIDER_SITE_OTHER): Payer: BLUE CROSS/BLUE SHIELD | Admitting: Internal Medicine

## 2015-03-26 VITALS — BP 100/64 | Temp 98.1°F | Ht 64.5 in | Wt 127.3 lb

## 2015-03-26 DIAGNOSIS — Z0000 Encounter for general adult medical examination without abnormal findings: Secondary | ICD-10-CM | POA: Diagnosis not present

## 2015-03-26 DIAGNOSIS — M8580 Other specified disorders of bone density and structure, unspecified site: Secondary | ICD-10-CM | POA: Insufficient documentation

## 2015-03-26 LAB — CBC WITH DIFFERENTIAL/PLATELET
BASOS PCT: 0.9 % (ref 0.0–3.0)
Basophils Absolute: 0 10*3/uL (ref 0.0–0.1)
EOS ABS: 0.1 10*3/uL (ref 0.0–0.7)
Eosinophils Relative: 2.6 % (ref 0.0–5.0)
HEMATOCRIT: 42.8 % (ref 36.0–46.0)
Hemoglobin: 14.3 g/dL (ref 12.0–15.0)
Lymphocytes Relative: 39.4 % (ref 12.0–46.0)
Lymphs Abs: 1.7 10*3/uL (ref 0.7–4.0)
MCHC: 33.4 g/dL (ref 30.0–36.0)
MCV: 93.3 fl (ref 78.0–100.0)
MONO ABS: 0.3 10*3/uL (ref 0.1–1.0)
Monocytes Relative: 7.2 % (ref 3.0–12.0)
NEUTROS ABS: 2.2 10*3/uL (ref 1.4–7.7)
Neutrophils Relative %: 49.9 % (ref 43.0–77.0)
PLATELETS: 183 10*3/uL (ref 150.0–400.0)
RBC: 4.59 Mil/uL (ref 3.87–5.11)
RDW: 12.9 % (ref 11.5–15.5)
WBC: 4.3 10*3/uL (ref 4.0–10.5)

## 2015-03-26 LAB — BASIC METABOLIC PANEL
BUN: 17 mg/dL (ref 6–23)
CALCIUM: 9.8 mg/dL (ref 8.4–10.5)
CO2: 30 meq/L (ref 19–32)
CREATININE: 0.74 mg/dL (ref 0.40–1.20)
Chloride: 103 mEq/L (ref 96–112)
GFR: 84.38 mL/min (ref >60.00)
GLUCOSE: 84 mg/dL (ref 70–99)
Potassium: 4.2 mEq/L (ref 3.5–5.1)
Sodium: 139 mEq/L (ref 135–145)

## 2015-03-26 LAB — POCT URINALYSIS DIP (MANUAL ENTRY)
Bilirubin, UA: NEGATIVE
Glucose, UA: NEGATIVE
Ketones, POC UA: NEGATIVE
LEUKOCYTES UA: NEGATIVE
Nitrite, UA: NEGATIVE
PH UA: 5
Protein Ur, POC: NEGATIVE
RBC UA: NEGATIVE
Spec Grav, UA: 1.02
Urobilinogen, UA: 0.2

## 2015-03-26 LAB — LIPID PANEL
CHOL/HDL RATIO: 3
Cholesterol: 211 mg/dL — ABNORMAL HIGH (ref 0–200)
HDL: 75.8 mg/dL (ref >39.00)
LDL CALC: 121 mg/dL — AB (ref 0–99)
NonHDL: 135.2
TRIGLYCERIDES: 69 mg/dL (ref 0.0–149.0)
VLDL: 13.8 mg/dL (ref 0.0–40.0)

## 2015-03-26 LAB — HEPATIC FUNCTION PANEL
ALK PHOS: 77 U/L (ref 39–117)
ALT: 12 U/L (ref 0–35)
AST: 19 U/L (ref 0–37)
Albumin: 4.6 g/dL (ref 3.5–5.2)
BILIRUBIN DIRECT: 0.1 mg/dL (ref 0.0–0.3)
BILIRUBIN TOTAL: 0.5 mg/dL (ref 0.2–1.2)
TOTAL PROTEIN: 7.5 g/dL (ref 6.0–8.3)

## 2015-03-26 LAB — TSH: TSH: 0.92 u[IU]/mL (ref 0.35–4.50)

## 2015-03-26 NOTE — Patient Instructions (Signed)
Continue lifestyle intervention healthy eating and exercise . Will notify you  of labs when available.  Healthy lifestyle includes : At least 150 minutes of exercise weeks  , weight at healthy levels, which is usually   BMI 19-25. Avoid trans fats and processed foods;  Increase fresh fruits and veges to 5 servings per day. And avoid sweet beverages including tea and juice. Mediterranean diet with olive oil and nuts have been noted to be heart and brain healthy . Avoid tobacco products . Limit  alcohol to  7 per week for women and 14 servings for men.  Get adequate sleep . Wear seat belts . Don't text and drive .

## 2015-03-26 NOTE — Progress Notes (Signed)
Pre visit review using our clinic review tool, if applicable. No additional management support is needed unless otherwise documented below in the visit note.  Chief Complaint  Patient presents with  . Annual Exam    HPI: Patient  Patricia Potts  64 y.o. comes in today for Preventive Health Care visit  Pre squamous cell  Left arm .  Q 6 months . Per dr [name]' Dr Helane Rima gyne utd no sx has low vit d on supplement following stable dexa osteopenia      Health Maintenance  Topic Date Due  . HIV Screening  03/20/2016 (Originally 09/25/1966)  . INFLUENZA VACCINE  05/21/2015  . MAMMOGRAM  09/18/2016  . TETANUS/TDAP  10/20/2017  . PAP SMEAR  01/18/2018  . COLONOSCOPY  02/08/2022  . ZOSTAVAX  Completed   Health Maintenance Review LIFESTYLE:  Exercise:     sporadically Tobacco/ETS:no Alcohol: 1-2 per week  Sugar beverages: Sleep:  Aim for 7   Some hot flushes  Drug use: no Bone density:  Osteopenia   On vit D.   Really lwo  1000 vit d per day  Colonoscopy:  PAP: grewal  Nov 15 MAMMO:j just had .     ROS:  GEN/ HEENT: No fever, significant weight changes sweats headaches vision problems hearing changes, CV/ PULM; No chest pain shortness of breath cough, syncope,edema  change in exercise tolerance. GI /GU: No adominal pain, vomiting, change in bowel habits. No blood in the stool. No significant GU symptoms. SKIN/HEME: ,no acute skin rashes suspicious lesions or bleeding. No lymphadenopathy, nodules, masses.  NEURO/ PSYCH:  No neurologic signs such as weakness numbness. No depression anxiety. IMM/ Allergy: No unusual infections.  Allergy .   REST of 12 system review negative except as per HPI   Past Medical History  Diagnosis Date  . Seasonal allergies   . Chickenpox   . Actinic keratosis     sees derm 2 x per wyear.  . Osteopenia     Dexa Scan per gyne  . Migraine     as teen young adult hormonal  . Uterine polyp     Past Surgical History  Procedure Laterality Date  .  Cesarean section  [id number]  . Tubal ligation  1989  . Mucoid cysts      Removed from both hands and fingers sypher   . Polyp      uterine removal     Family History  Problem Relation Age of Onset  . Cancer Mother     Squamous cell cancer  . Coronary artery disease Father     died in 66s  had  pvd   . Hyperlipidemia Father   . Hypertension Father   . Pulmonary embolism Brother     [facility] for hypercoag state   . Cancer Maternal Grandmother     Colon cancer  . Cancer Maternal Grandfather     Colon cancer  . Down syndrome Son     History   Social History  . Marital Status: Married    Spouse Name: N/A  . Number of Children: 3  . Years of Education: N/A   Occupational History  . RN     Retired   Social History Main Topics  . Smoking status: Never Smoker   . Smokeless tobacco: Not on file  . Alcohol Use: Yes  . Drug Use: Not on file  . Sexual Activity: Not on file   Other Topics Concern  . None  Social History Narrative   Married    HH of 3    RN Masters level retired Therapist, occupational   No pets    [id number]   Soc etoh no tobacco          Outpatient Prescriptions Prior to Visit  Medication Sig Dispense Refill  . cetirizine (ZYRTEC) 10 MG tablet Take 10 mg by mouth daily.    . cholecalciferol (VITAMIN D) 1000 UNITS tablet Take 1,000 Units by mouth daily.     No facility-administered medications prior to visit.     EXAM:  BP 100/64 mmHg  Temp(Src) 98.1 F (36.7 C) (Oral)  Ht 5' 4.5" (1.638 m)  Wt 127 lb 4.8 oz (57.743 kg)  BMI 21.52 kg/m2  LMP 08/07/2011  Body mass index is 21.52 kg/(m^2).  Physical Exam: Vital signs reviewed YBO:FBPZ is a well-developed well-nourished alert cooperative    who appearsr stated age in no acute distress.  HEENT: normocephalic atraumatic , Eyes: PERRL EOM's full, conjunctiva clear, Nares: paten,t no deformity discharge or tenderness., Ears: no deformity EAC's clear TMs with normal landmarks. Mouth:  clear OP, no lesions, edema.  Moist mucous membranes. Dentition in adequate repair. NECK: supple without masses, thyromegaly or bruits. CHEST/PULM:  Clear to auscultation and percussion breath sounds equal no wheeze , rales or rhonchi. No chest wall deformities or tenderness.Breast: normal by inspection . No dimpling, discharge, masses, tenderness or discharge . CV: PMI is nondisplaced, S1 S2 no gallops, murmurs, rubs. Peripheral pulses are full without delay.No JVD .  ABDOMEN: Bowel sounds normal nontender  No guard or rebound, no hepato splenomegal no CVA tenderness.  No hernia. Extremtities:  No clubbing cyanosis or edema, no acute joint swelling or redness no focal atrophy NEURO:  Oriented x3, cranial nerves 3-12 appear to be intact, no obvious focal weakness,gait within normal limits no abnormal reflexes or asymmetrical SKIN: No acute rashes normal turgor, color, no bruising or petechiae.sun changes freckling  PSYCH: Oriented, good eye contact, no obvious depression anxiety, cognition and judgment appear normal. LN: no cervical axillary inguinal adenopathy    ASSESSMENT AND PLAN:  Discussed the following assessment and plan:  Visit for preventive health examination - utd hcp - Plan: Basic metabolic panel, CBC with Differential/Platelet, Hepatic function panel, Lipid panel, TSH, POCT urinalysis dipstick  Osteopenia  Patient Care Team: Burnis Medin, MD as PCP - General Dian Queen, MD as Attending Physician (Obstetrics and Gynecology) Amy [name], MD as Consulting Physician (Dermatology) Sharyne Peach, MD as Attending Physician (Ophthalmology) Patient Instructions  Continue lifestyle intervention healthy eating and exercise . Will notify you  of labs when available.  Healthy lifestyle includes : At least 150 minutes of exercise weeks  , weight at healthy levels, which is usually   BMI 19-25. Avoid trans fats and processed foods;  Increase fresh fruits and veges to 5 servings  per day. And avoid sweet beverages including tea and juice. Mediterranean diet with olive oil and nuts have been noted to be heart and brain healthy . Avoid tobacco products . Limit  alcohol to  7 per week for women and 14 servings for men.  Get adequate sleep . Wear seat belts . Don't text and drive .      Standley Brooking. Darian Ace M.D.

## 2015-08-20 ENCOUNTER — Other Ambulatory Visit: Payer: Self-pay

## 2015-08-20 DIAGNOSIS — Z1231 Encounter for screening mammogram for malignant neoplasm of breast: Secondary | ICD-10-CM

## 2015-09-25 ENCOUNTER — Ambulatory Visit
Admission: RE | Admit: 2015-09-25 | Discharge: 2015-09-25 | Disposition: A | Discharge status: Home / Self Care | Payer: BLUE CROSS/BLUE SHIELD | Source: Ambulatory Visit

## 2015-09-25 DIAGNOSIS — Z1231 Encounter for screening mammogram for malignant neoplasm of breast: Secondary | ICD-10-CM

## 2016-08-18 ENCOUNTER — Other Ambulatory Visit: Payer: Self-pay | Admitting: Obstetrics and Gynecology

## 2016-08-18 DIAGNOSIS — Z1231 Encounter for screening mammogram for malignant neoplasm of breast: Secondary | ICD-10-CM

## 2016-09-25 ENCOUNTER — Ambulatory Visit
Admission: RE | Admit: 2016-09-25 | Discharge: 2016-09-25 | Disposition: A | Discharge status: Home / Self Care | Payer: BLUE CROSS/BLUE SHIELD | Source: Ambulatory Visit | Attending: Obstetrics and Gynecology | Admitting: Obstetrics and Gynecology

## 2016-09-25 DIAGNOSIS — Z1231 Encounter for screening mammogram for malignant neoplasm of breast: Secondary | ICD-10-CM

## 2016-10-14 NOTE — Progress Notes (Signed)
Pre visit review using our clinic review tool, if applicable. No additional management support is needed unless otherwise documented below in the visit note.  Chief Complaint  Patient presents with  . Annual Exam    HPI: Patient  Patricia Potts  65 y.o. comes in today for Preventive Health Care visit  Gets her GYN visits yearly with Dr. Helane Rima and is up today. Here with daughter [name]  Also sees Amy [name] for skin surveillance history of actinic sun damage precancerous. She has seen Dr. Nelva Bush this past year for back issues and has had some injections trying to stay healthy walking on a regular basis. More recently he has had some pain on her "hip". Points to left side above the ilium. Does have a history of back pain radiating to the leg. Does have a history of scoliosis. No known history of screening for hepatitis C has not donated blood recently no transfusion but has had needle sticks in remote the past occupational  But  does not think she needs to be screened for HIV.  Health Maintenance  Topic Date Due  . Hepatitis C Screening  10/14/2017 (Originally September 23, 1951)  . HIV Screening  10/14/2017 (Originally 09/25/1966)  . TETANUS/TDAP  10/20/2017  . PAP SMEAR  01/18/2018  . MAMMOGRAM  09/25/2018  . COLONOSCOPY  02/08/2022  . INFLUENZA VACCINE  Addressed  . ZOSTAVAX  Completed   Health Maintenance Review LIFESTYLE:  Exercise:   Exercising  Walking  Hip issues  Battling  Left area  Tobacco/ETS:  no Alcohol:  2 per week  Sugar beverages:no Sleep: when back ok 7 hours      Drug use: no HH of  3   No pets   Work: Environmental health practitioner.       ROS:  Vision hearing   Glasses . See above. Had a recent URI was self treated with azithromycin just finished no chest pain shortness of breath or fever GEN/ HEENT: No fever, significant weight changes sweats headaches vision problems hearing changes, CV/ PULM; No chest pain shortness of breath cough, syncope,edema  change in exercise  tolerance. GI /GU: No adominal pain, vomiting, change in bowel habits. No blood in the stool. No significant GU symptoms. SKIN/HEME: ,no acute skin rashes suspicious lesions or bleeding. No lymphadenopathy, nodules, masses.  NEURO/ PSYCH:  No neurologic signs such as weakness numbness. No depression anxiety. IMM/ Allergy: No unusual infections.  Allergy .   REST of 12 system review negative except as per HPI   Past Medical History:  Diagnosis Date  . Actinic keratosis    sees derm 2 x per wyear.  . Chickenpox   . Migraine    as teen young adult hormonal  . Osteopenia    Dexa Scan per gyne  . Scoliosis   . Seasonal allergies   . Uterine polyp     Past Surgical History:  Procedure Laterality Date  . CESAREAN SECTION  [phone]  . Mucoid Cysts     Removed from both hands and fingers sypher   . polyp     uterine removal   . TUBAL LIGATION  1989    Family History  Problem Relation Age of Onset  . Cancer Mother     Squamous cell cancer  . Coronary artery disease Father     died in 58s  had  pvd   . Hyperlipidemia Father   . Hypertension Father   . Pulmonary embolism Brother  evaluatied at [facility] for hypercoag state   . Cancer Maternal Grandmother     Colon cancer  . Cancer Maternal Grandfather     Colon cancer  . Down syndrome Son     Social History   Social History  . Marital status: Married    Spouse name: N/A  . Number of children: 3  . Years of education: N/A   Occupational History  . RN     Retired   Social History Main Topics  . Smoking status: Never Smoker  . Smokeless tobacco: None  . Alcohol use Yes  . Drug use: Unknown  . Sexual activity: Not Asked   Other Topics Concern  . None   Social History Narrative   Married    HH of 3    RN Masters level retired Therapist, occupational   No pets    G3P3   Soc etoh no tobacco          Outpatient Medications Prior to Visit  Medication Sig Dispense Refill  . cetirizine (ZYRTEC) 10 MG tablet  Take 10 mg by mouth daily.    . cholecalciferol (VITAMIN D) 1000 UNITS tablet Take 1,000 Units by mouth daily.     No facility-administered medications prior to visit.      EXAM:  BP 120/64 (BP Location: Right Arm, Patient Position: Sitting, Cuff Size: Normal)   Temp 97.8 F (36.6 C) (Oral)   Ht 5' 4.75" (1.645 m)   Wt 129 lb 12.8 oz (58.9 kg)   LMP 08/07/2011   BMI 21.77 kg/m   Body mass index is 21.77 kg/m.  Physical Exam: Vital signs reviewed WGY:KZLD is a well-developed well-nourished alert cooperative    who appearsr stated age in no acute distress.  HEENT: normocephalic atraumatic , Eyes: PERRL EOM's full, conjunctiva clearHas glasses., Nares: paten,t no deformity discharge or tenderness., Ears: no deformity EAC's clear TMs with normal landmarks. Mouth: clear OP, no lesions, edema.  Moist mucous membranes. Dentition in adequate repair. NECK: supple without masses, thyromegaly or bruits. CHEST/PULM:  Clear to auscultation and percussion breath sounds equal no wheeze , rales or rhonchi. No chest wall deformities or tenderness.Breast: normal by inspection . No dimpling, discharge, masses, tenderness or discharge . CV: PMI is nondisplaced, S1 S2 no gallops, murmurs, rubs. There is an intermittent scratchy click midsystolic when lying down I don't hear it sitting up. Peripheral pulses are full without delay.No JVD .  ABDOMEN: Bowel sounds normal nontender  No guard or rebound, no hepato splenomegal no CVA tenderness.  No hernia. Extremtities:  No clubbing cyanosis or edema, no acute joint swelling or redness no focal atrophy Has some mild scoliosis points to left lateral truncal supra pelvic area as area of pain and flank. No limping normal gait no obvious weakness. NEURO:  Oriented x3, cranial nerves 3-12 appear to be intact, no obvious focal weakness,gait within normal limits no abnormal reflexes or asymmetrical SKIN: No acute rashes normal turgor, color, no bruising or petechiae.  Sun changes. PSYCH: Oriented, good eye contact, no obvious depression anxiety, cognition and judgment appear normal. LN: no cervical axillary inguinal adenopathy    ASSESSMENT AND PLAN:  Discussed the following assessment and plan:  Visit for preventive health examination - Plan: Basic metabolic panel, CBC with Differential/Platelet, Hepatic function panel, Lipid panel, TSH, Hepatitis C antibody  Need for hepatitis C screening test - Plan: Hepatitis C antibody  Musculoskeletal pain Reviewed findings healthcare maintenance  And let know labs results when  available. Yearly  cardiac exam hep C screen today. Discussed having her see sports medicine in regard to her other musculoskeletal symptoms see below. Patient Care Team: Burnis Medin, MD as PCP - General Dian Queen, MD as Attending Physician (Obstetrics and Gynecology) Amy [name], MD as Consulting Physician (Dermatology) Sharyne Peach, MD as Attending Physician (Ophthalmology) Suella Broad, MD as Consulting Physician (Physical Medicine and Rehabilitation) Patient Instructions  Continue lifestyle intervention healthy eating and exercise .  Will notify you  of labs when available.  Check into sports med evaluation with hx of scoliosis evaluate for leg length discrepancy and gait issues that may be adding to your back side pain .   I hear a intermittent click sound  ( no murmur)   that is probably clinically insignificant but  Check  Heart exam yearly .    Health Maintenance, Female Introduction Adopting a healthy lifestyle and getting preventive care can go a long way to promote health and wellness. Talk with your health care provider about what schedule of regular examinations is right for you. This is a good chance for you to check in with your provider about disease prevention and staying healthy. In between checkups, there are plenty of things you can do on your own. Experts have done a lot of research about which  lifestyle changes and preventive measures are most likely to keep you healthy. Ask your health care provider for more information. Weight and diet Eat a healthy diet  Be sure to include plenty of vegetables, fruits, low-fat dairy products, and lean protein.  Do not eat a lot of foods high in solid fats, added sugars, or salt.  Get regular exercise. This is one of the most important things you can do for your health.  Most adults should exercise for at least 150 minutes each week. The exercise should increase your heart rate and make you sweat (moderate-intensity exercise).  Most adults should also do strengthening exercises at least twice a week. This is in addition to the moderate-intensity exercise. Maintain a healthy weight  Body mass index (BMI) is a measurement that can be used to identify possible weight problems. It estimates body fat based on height and weight. Your health care provider can help determine your BMI and help you achieve or maintain a healthy weight.  For females 32 years of age and older:  A BMI below 18.5 is considered underweight.  A BMI of 18.5 to 24.9 is normal.  A BMI of 25 to 29.9 is considered overweight.  A BMI of 30 and above is considered obese. Watch levels of cholesterol and blood lipids  You should start having your blood tested for lipids and cholesterol at 64 years of age, then have this test every 5 years.  You may need to have your cholesterol levels checked more often if:  Your lipid or cholesterol levels are high.  You are older than 64 years of age.  You are at high risk for heart disease. Cancer screening Lung Cancer  Lung cancer screening is recommended for adults 50-43 years old who are at high risk for lung cancer because of a history of smoking.  A yearly low-dose CT scan of the lungs is recommended for people who:  Currently smoke.  Have quit within the past 15 years.  Have at least a 30-pack-year history of smoking. A  pack year is smoking an average of one pack of cigarettes a day for 1 year.  Yearly screening should continue until it has been  15 years since you quit.  Yearly screening should stop if you develop a health problem that would prevent you from having lung cancer treatment. Breast Cancer  Practice breast self-awareness. This means understanding how your breasts normally appear and feel.  It also means doing regular breast self-exams. Let your health care provider know about any changes, no matter how small.  If you are in your 20s or 30s, you should have a clinical breast exam (CBE) by a health care provider every 1-3 years as part of a regular health exam.  If you are 44 or older, have a CBE every year. Also consider having a breast X-ray (mammogram) every year.  If you have a family history of breast cancer, talk to your health care provider about genetic screening.  If you are at high risk for breast cancer, talk to your health care provider about having an MRI and a mammogram every year.  Breast cancer gene (BRCA) assessment is recommended for women who have family members with BRCA-related cancers. BRCA-related cancers include:  Breast.  Ovarian.  Tubal.  Peritoneal cancers.  Results of the assessment will determine the need for genetic counseling and BRCA1 and BRCA2 testing. Cervical Cancer  Your health care provider may recommend that you be screened regularly for cancer of the pelvic organs (ovaries, uterus, and vagina). This screening involves a pelvic examination, including checking for microscopic changes to the surface of your cervix (Pap test). You may be encouraged to have this screening done every 3 years, beginning at age 31.  For women ages 25-65, health care providers may recommend pelvic exams and Pap testing every 3 years, or they may recommend the Pap and pelvic exam, combined with testing for human papilloma virus (HPV), every 5 years. Some types of HPV increase  your risk of cervical cancer. Testing for HPV may also be done on women of any age with unclear Pap test results.  Other health care providers may not recommend any screening for nonpregnant women who are considered low risk for pelvic cancer and who do not have symptoms. Ask your health care provider if a screening pelvic exam is right for you.  If you have had past treatment for cervical cancer or a condition that could lead to cancer, you need Pap tests and screening for cancer for at least 20 years after your treatment. If Pap tests have been discontinued, your risk factors (such as having a new sexual partner) need to be reassessed to determine if screening should resume. Some women have medical problems that increase the chance of getting cervical cancer. In these cases, your health care provider may recommend more frequent screening and Pap tests. Colorectal Cancer  This type of cancer can be detected and often prevented.  Routine colorectal cancer screening usually begins at 64 years of age and continues through 64 years of age.  Your health care provider may recommend screening at an earlier age if you have risk factors for colon cancer.  Your health care provider may also recommend using home test kits to check for hidden blood in the stool.  A small camera at the end of a tube can be used to examine your colon directly (sigmoidoscopy or colonoscopy). This is done to check for the earliest forms of colorectal cancer.  Routine screening usually begins at age 34.  Direct examination of the colon should be repeated every 5-10 years through 64 years of age. However, you may need to be screened more often if early  forms of precancerous polyps or small growths are found. Skin Cancer  Check your skin from head to toe regularly.  Tell your health care provider about any new moles or changes in moles, especially if there is a change in a mole's shape or color.  Also tell your health care  provider if you have a mole that is larger than the size of a pencil eraser.  Always use sunscreen. Apply sunscreen liberally and repeatedly throughout the day.  Protect yourself by wearing long sleeves, pants, a wide-brimmed hat, and sunglasses whenever you are outside. Heart disease, diabetes, and high blood pressure  High blood pressure causes heart disease and increases the risk of stroke. High blood pressure is more likely to develop in:  People who have blood pressure in the high end of the normal range (130-139/85-89 mm Hg).  People who are overweight or obese.  People who are African American.  If you are 65-31 years of age, have your blood pressure checked every 3-5 years. If you are 81 years of age or older, have your blood pressure checked every year. You should have your blood pressure measured twice-once when you are at a hospital or clinic, and once when you are not at a hospital or clinic. Record the average of the two measurements. To check your blood pressure when you are not at a hospital or clinic, you can use:  An automated blood pressure machine at a pharmacy.  A home blood pressure monitor.  If you are between 68 years and 31 years old, ask your health care provider if you should take aspirin to prevent strokes.  Have regular diabetes screenings. This involves taking a blood sample to check your fasting blood sugar level.  If you are at a normal weight and have a low risk for diabetes, have this test once every three years after 64 years of age.  If you are overweight and have a high risk for diabetes, consider being tested at a younger age or more often. Preventing infection Hepatitis B  If you have a higher risk for hepatitis B, you should be screened for this virus. You are considered at high risk for hepatitis B if:  You were born in a country where hepatitis B is common. Ask your health care provider which countries are considered high risk.  Your parents  were born in a high-risk country, and you have not been immunized against hepatitis B (hepatitis B vaccine).  You have HIV or AIDS.  You use needles to inject street drugs.  You live with someone who has hepatitis B.  You have had sex with someone who has hepatitis B.  You get hemodialysis treatment.  You take certain medicines for conditions, including cancer, organ transplantation, and autoimmune conditions. Hepatitis C  Blood testing is recommended for:  Everyone born from [birth year] through 1964.  Anyone with known risk factors for hepatitis C. Sexually transmitted infections (STIs)  You should be screened for sexually transmitted infections (STIs) including gonorrhea and chlamydia if:  You are sexually active and are younger than 64 years of age.  You are older than 64 years of age and your health care provider tells you that you are at risk for this type of infection.  Your sexual activity has changed since you were last screened and you are at an increased risk for chlamydia or gonorrhea. Ask your health care provider if you are at risk.  If you do not have HIV, but are at risk,  it may be recommended that you take a prescription medicine daily to prevent HIV infection. This is called pre-exposure prophylaxis (PrEP). You are considered at risk if:  You are sexually active and do not regularly use condoms or know the HIV status of your partner(s).  You take drugs by injection.  You are sexually active with a partner who has HIV. Talk with your health care provider about whether you are at high risk of being infected with HIV. If you choose to begin PrEP, you should first be tested for HIV. You should then be tested every 3 months for as long as you are taking PrEP. Pregnancy  If you are premenopausal and you may become pregnant, ask your health care provider about preconception counseling.  If you may become pregnant, take 400 to 800 micrograms (mcg) of folic acid every  day.  If you want to prevent pregnancy, talk to your health care provider about birth control (contraception). Osteoporosis and menopause  Osteoporosis is a disease in which the bones lose minerals and strength with aging. This can result in serious bone fractures. Your risk for osteoporosis can be identified using a bone density scan.  If you are 54 years of age or older, or if you are at risk for osteoporosis and fractures, ask your health care provider if you should be screened.  Ask your health care provider whether you should take a calcium or vitamin D supplement to lower your risk for osteoporosis.  Menopause may have certain physical symptoms and risks.  Hormone replacement therapy may reduce some of these symptoms and risks. Talk to your health care provider about whether hormone replacement therapy is right for you. Follow these instructions at home:  Schedule regular health, dental, and eye exams.  Stay current with your immunizations.  Do not use any tobacco products including cigarettes, chewing tobacco, or electronic cigarettes.  If you are pregnant, do not drink alcohol.  If you are breastfeeding, limit how much and how often you drink alcohol.  Limit alcohol intake to no more than 1 drink per day for nonpregnant women. One drink equals 12 ounces of beer, 5 ounces of wine, or 1 ounces of hard liquor.  Do not use street drugs.  Do not share needles.  Ask your health care provider for help if you need support or information about quitting drugs.  Tell your health care provider if you often feel depressed.  Tell your health care provider if you have ever been abused or do not feel safe at home. This information is not intended to replace advice given to you by your health care provider. Make sure you discuss any questions you have with your health care provider. Document Released: 04/21/2011 Document Revised: 03/13/2016 Document Reviewed: 07/10/2015  2017  Elsevier    Mariann Laster K. Panosh M.D.

## 2016-10-15 ENCOUNTER — Encounter: Payer: Self-pay | Admitting: Internal Medicine

## 2016-10-15 ENCOUNTER — Ambulatory Visit (INDEPENDENT_AMBULATORY_CARE_PROVIDER_SITE_OTHER): Payer: BLUE CROSS/BLUE SHIELD | Admitting: Internal Medicine

## 2016-10-15 VITALS — BP 120/64 | Temp 97.8°F | Ht 64.75 in | Wt 129.8 lb

## 2016-10-15 DIAGNOSIS — Z1159 Encounter for screening for other viral diseases: Secondary | ICD-10-CM

## 2016-10-15 DIAGNOSIS — Z0000 Encounter for general adult medical examination without abnormal findings: Secondary | ICD-10-CM

## 2016-10-15 DIAGNOSIS — M791 Myalgia: Secondary | ICD-10-CM

## 2016-10-15 DIAGNOSIS — M7918 Myalgia, other site: Secondary | ICD-10-CM

## 2016-10-15 LAB — CBC WITH DIFFERENTIAL/PLATELET
BASOS PCT: 0.7 % (ref 0.0–3.0)
Basophils Absolute: 0 10*3/uL (ref 0.0–0.1)
EOS PCT: 0.3 % (ref 0.0–5.0)
Eosinophils Absolute: 0 10*3/uL (ref 0.0–0.7)
HCT: 41.3 % (ref 36.0–46.0)
Hemoglobin: 13.9 g/dL (ref 12.0–15.0)
LYMPHS ABS: 1.3 10*3/uL (ref 0.7–4.0)
Lymphocytes Relative: 22.5 % (ref 12.0–46.0)
MCHC: 33.6 g/dL (ref 30.0–36.0)
MCV: 92.6 fl (ref 78.0–100.0)
MONO ABS: 0.1 10*3/uL (ref 0.1–1.0)
MONOS PCT: 2.5 % — AB (ref 3.0–12.0)
NEUTROS ABS: 4.3 10*3/uL (ref 1.4–7.7)
NEUTROS PCT: 74 % (ref 43.0–77.0)
PLATELETS: 224 10*3/uL (ref 150.0–400.0)
RBC: 4.46 Mil/uL (ref 3.87–5.11)
RDW: 12.7 % (ref 11.5–15.5)
WBC: 5.9 10*3/uL (ref 4.0–10.5)

## 2016-10-15 LAB — HEPATIC FUNCTION PANEL
ALT: 11 U/L (ref 0–35)
AST: 16 U/L (ref 0–37)
Albumin: 4.5 g/dL (ref 3.5–5.2)
Alkaline Phosphatase: 71 U/L (ref 39–117)
Bilirubin, Direct: 0.1 mg/dL (ref 0.0–0.3)
TOTAL PROTEIN: 7 g/dL (ref 6.0–8.3)
Total Bilirubin: 0.5 mg/dL (ref 0.2–1.2)

## 2016-10-15 LAB — BASIC METABOLIC PANEL
BUN: 22 mg/dL (ref 6–23)
CO2: 29 mEq/L (ref 19–32)
Calcium: 9.5 mg/dL (ref 8.4–10.5)
Chloride: 103 mEq/L (ref 96–112)
Creatinine, Ser: 0.67 mg/dL (ref 0.40–1.20)
GFR: 94.17 mL/min (ref >60.00)
GLUCOSE: 101 mg/dL — AB (ref 70–99)
POTASSIUM: 4.2 meq/L (ref 3.5–5.1)
SODIUM: 141 meq/L (ref 135–145)

## 2016-10-15 LAB — LIPID PANEL
CHOL/HDL RATIO: 3
Cholesterol: 194 mg/dL (ref 0–200)
HDL: 66.7 mg/dL (ref >39.00)
LDL CALC: 112 mg/dL — AB (ref 0–99)
NONHDL: 127.51
Triglycerides: 78 mg/dL (ref 0.0–149.0)
VLDL: 15.6 mg/dL (ref 0.0–40.0)

## 2016-10-15 LAB — TSH: TSH: 0.77 u[IU]/mL (ref 0.35–4.50)

## 2016-10-15 NOTE — Patient Instructions (Signed)
Continue lifestyle intervention healthy eating and exercise .  Will notify you  of labs when available.  Check into sports med evaluation with hx of scoliosis evaluate for leg length discrepancy and gait issues that may be adding to your back side pain .   I hear a intermittent click sound  ( no murmur)   that is probably clinically insignificant but  Check  Heart exam yearly .    Health Maintenance, Female Introduction Adopting a healthy lifestyle and getting preventive care can go a long way to promote health and wellness. Talk with your health care provider about what schedule of regular examinations is right for you. This is a good chance for you to check in with your provider about disease prevention and staying healthy. In between checkups, there are plenty of things you can do on your own. Experts have done a lot of research about which lifestyle changes and preventive measures are most likely to keep you healthy. Ask your health care provider for more information. Weight and diet Eat a healthy diet  Be sure to include plenty of vegetables, fruits, low-fat dairy products, and lean protein.  Do not eat a lot of foods high in solid fats, added sugars, or salt.  Get regular exercise. This is one of the most important things you can do for your health.  Most adults should exercise for at least 150 minutes each week. The exercise should increase your heart rate and make you sweat (moderate-intensity exercise).  Most adults should also do strengthening exercises at least twice a week. This is in addition to the moderate-intensity exercise. Maintain a healthy weight  Body mass index (BMI) is a measurement that can be used to identify possible weight problems. It estimates body fat based on height and weight. Your health care provider can help determine your BMI and help you achieve or maintain a healthy weight.  For females 37 years of age and older:  A BMI below 18.5 is considered  underweight.  A BMI of 18.5 to 24.9 is normal.  A BMI of 25 to 29.9 is considered overweight.  A BMI of 30 and above is considered obese. Watch levels of cholesterol and blood lipids  You should start having your blood tested for lipids and cholesterol at 64 years of age, then have this test every 5 years.  You may need to have your cholesterol levels checked more often if:  Your lipid or cholesterol levels are high.  You are older than 64 years of age.  You are at high risk for heart disease. Cancer screening Lung Cancer  Lung cancer screening is recommended for adults 53-21 years old who are at high risk for lung cancer because of a history of smoking.  A yearly low-dose CT scan of the lungs is recommended for people who:  Currently smoke.  Have quit within the past 15 years.  Have at least a 30-pack-year history of smoking. A pack year is smoking an average of one pack of cigarettes a day for 1 year.  Yearly screening should continue until it has been 15 years since you quit.  Yearly screening should stop if you develop a health problem that would prevent you from having lung cancer treatment. Breast Cancer  Practice breast self-awareness. This means understanding how your breasts normally appear and feel.  It also means doing regular breast self-exams. Let your health care provider know about any changes, no matter how small.  If you are in your 78s  or 81s, you should have a clinical breast exam (CBE) by a health care provider every 1-3 years as part of a regular health exam.  If you are 21 or older, have a CBE every year. Also consider having a breast X-ray (mammogram) every year.  If you have a family history of breast cancer, talk to your health care provider about genetic screening.  If you are at high risk for breast cancer, talk to your health care provider about having an MRI and a mammogram every year.  Breast cancer gene (BRCA) assessment is recommended  for women who have family members with BRCA-related cancers. BRCA-related cancers include:  Breast.  Ovarian.  Tubal.  Peritoneal cancers.  Results of the assessment will determine the need for genetic counseling and BRCA1 and BRCA2 testing. Cervical Cancer  Your health care provider may recommend that you be screened regularly for cancer of the pelvic organs (ovaries, uterus, and vagina). This screening involves a pelvic examination, including checking for microscopic changes to the surface of your cervix (Pap test). You may be encouraged to have this screening done every 3 years, beginning at age 62.  For women ages 52-65, health care providers may recommend pelvic exams and Pap testing every 3 years, or they may recommend the Pap and pelvic exam, combined with testing for human papilloma virus (HPV), every 5 years. Some types of HPV increase your risk of cervical cancer. Testing for HPV may also be done on women of any age with unclear Pap test results.  Other health care providers may not recommend any screening for nonpregnant women who are considered low risk for pelvic cancer and who do not have symptoms. Ask your health care provider if a screening pelvic exam is right for you.  If you have had past treatment for cervical cancer or a condition that could lead to cancer, you need Pap tests and screening for cancer for at least 20 years after your treatment. If Pap tests have been discontinued, your risk factors (such as having a new sexual partner) need to be reassessed to determine if screening should resume. Some women have medical problems that increase the chance of getting cervical cancer. In these cases, your health care provider may recommend more frequent screening and Pap tests. Colorectal Cancer  This type of cancer can be detected and often prevented.  Routine colorectal cancer screening usually begins at 64 years of age and continues through 64 years of age.  Your health  care provider may recommend screening at an earlier age if you have risk factors for colon cancer.  Your health care provider may also recommend using home test kits to check for hidden blood in the stool.  A small camera at the end of a tube can be used to examine your colon directly (sigmoidoscopy or colonoscopy). This is done to check for the earliest forms of colorectal cancer.  Routine screening usually begins at age 19.  Direct examination of the colon should be repeated every 5-10 years through 64 years of age. However, you may need to be screened more often if early forms of precancerous polyps or small growths are found. Skin Cancer  Check your skin from head to toe regularly.  Tell your health care provider about any new moles or changes in moles, especially if there is a change in a mole's shape or color.  Also tell your health care provider if you have a mole that is larger than the size of a pencil eraser.  Always use sunscreen. Apply sunscreen liberally and repeatedly throughout the day.  Protect yourself by wearing long sleeves, pants, a wide-brimmed hat, and sunglasses whenever you are outside. Heart disease, diabetes, and high blood pressure  High blood pressure causes heart disease and increases the risk of stroke. High blood pressure is more likely to develop in:  People who have blood pressure in the high end of the normal range (130-139/85-89 mm Hg).  People who are overweight or obese.  People who are African American.  If you are 11-48 years of age, have your blood pressure checked every 3-5 years. If you are 35 years of age or older, have your blood pressure checked every year. You should have your blood pressure measured twice-once when you are at a hospital or clinic, and once when you are not at a hospital or clinic. Record the average of the two measurements. To check your blood pressure when you are not at a hospital or clinic, you can use:  An automated  blood pressure machine at a pharmacy.  A home blood pressure monitor.  If you are between 18 years and 1 years old, ask your health care provider if you should take aspirin to prevent strokes.  Have regular diabetes screenings. This involves taking a blood sample to check your fasting blood sugar level.  If you are at a normal weight and have a low risk for diabetes, have this test once every three years after 64 years of age.  If you are overweight and have a high risk for diabetes, consider being tested at a younger age or more often. Preventing infection Hepatitis B  If you have a higher risk for hepatitis B, you should be screened for this virus. You are considered at high risk for hepatitis B if:  You were born in a country where hepatitis B is common. Ask your health care provider which countries are considered high risk.  Your parents were born in a high-risk country, and you have not been immunized against hepatitis B (hepatitis B vaccine).  You have HIV or AIDS.  You use needles to inject street drugs.  You live with someone who has hepatitis B.  You have had sex with someone who has hepatitis B.  You get hemodialysis treatment.  You take certain medicines for conditions, including cancer, organ transplantation, and autoimmune conditions. Hepatitis C  Blood testing is recommended for:  Everyone born from [birth year] through 1964.  Anyone with known risk factors for hepatitis C. Sexually transmitted infections (STIs)  You should be screened for sexually transmitted infections (STIs) including gonorrhea and chlamydia if:  You are sexually active and are younger than 64 years of age.  You are older than 64 years of age and your health care provider tells you that you are at risk for this type of infection.  Your sexual activity has changed since you were last screened and you are at an increased risk for chlamydia or gonorrhea. Ask your health care provider if you are at  risk.  If you do not have HIV, but are at risk, it may be recommended that you take a prescription medicine daily to prevent HIV infection. This is called pre-exposure prophylaxis (PrEP). You are considered at risk if:  You are sexually active and do not regularly use condoms or know the HIV status of your partner(s).  You take drugs by injection.  You are sexually active with a partner who has HIV. Talk with your health care provider  about whether you are at high risk of being infected with HIV. If you choose to begin PrEP, you should first be tested for HIV. You should then be tested every 3 months for as long as you are taking PrEP. Pregnancy  If you are premenopausal and you may become pregnant, ask your health care provider about preconception counseling.  If you may become pregnant, take 400 to 800 micrograms (mcg) of folic acid every day.  If you want to prevent pregnancy, talk to your health care provider about birth control (contraception). Osteoporosis and menopause  Osteoporosis is a disease in which the bones lose minerals and strength with aging. This can result in serious bone fractures. Your risk for osteoporosis can be identified using a bone density scan.  If you are 67 years of age or older, or if you are at risk for osteoporosis and fractures, ask your health care provider if you should be screened.  Ask your health care provider whether you should take a calcium or vitamin D supplement to lower your risk for osteoporosis.  Menopause may have certain physical symptoms and risks.  Hormone replacement therapy may reduce some of these symptoms and risks. Talk to your health care provider about whether hormone replacement therapy is right for you. Follow these instructions at home:  Schedule regular health, dental, and eye exams.  Stay current with your immunizations.  Do not use any tobacco products including cigarettes, chewing tobacco, or electronic  cigarettes.  If you are pregnant, do not drink alcohol.  If you are breastfeeding, limit how much and how often you drink alcohol.  Limit alcohol intake to no more than 1 drink per day for nonpregnant women. One drink equals 12 ounces of beer, 5 ounces of wine, or 1 ounces of hard liquor.  Do not use street drugs.  Do not share needles.  Ask your health care provider for help if you need support or information about quitting drugs.  Tell your health care provider if you often feel depressed.  Tell your health care provider if you have ever been abused or do not feel safe at home. This information is not intended to replace advice given to you by your health care provider. Make sure you discuss any questions you have with your health care provider. Document Released: 04/21/2011 Document Revised: 03/13/2016 Document Reviewed: 07/10/2015  2017 [employer]

## 2016-10-16 LAB — HEPATITIS C ANTIBODY: HCV AB: NEGATIVE

## 2017-03-12 ENCOUNTER — Encounter: Payer: Self-pay | Admitting: Internal Medicine

## 2017-03-12 ENCOUNTER — Ambulatory Visit (INDEPENDENT_AMBULATORY_CARE_PROVIDER_SITE_OTHER): Payer: BLUE CROSS/BLUE SHIELD | Admitting: Internal Medicine

## 2017-03-12 VITALS — BP 110/64 | HR 71 | Temp 98.2°F | Ht 64.75 in | Wt 127.8 lb

## 2017-03-12 DIAGNOSIS — S30861A Insect bite (nonvenomous) of abdominal wall, initial encounter: Secondary | ICD-10-CM

## 2017-03-12 DIAGNOSIS — W57XXXA Bitten or stung by nonvenomous insect and other nonvenomous arthropods, initial encounter: Secondary | ICD-10-CM | POA: Diagnosis not present

## 2017-03-12 MED ORDER — DOXYCYCLINE HYCLATE 100 MG PO TABS: ORAL_TABLET | ORAL | 0 refills | Status: DC

## 2017-03-12 NOTE — Progress Notes (Signed)
Chief Complaint  Patient presents with  . Acute Visit    tick bite    HPI: Patricia Potts 66 y.o.  sda Comes in today because she noted a tick on her left abdomen probably they're from yesterday. It was small her husband help remove it with a tweezer thought it was intact and he was concerned about her getting tick bite prophylaxis. May not been a deer tick non-engorged no fever some irritation at the site. ROS: See pertinent positives and negatives per HPI.  Past Medical History:  Diagnosis Date  . Actinic keratosis    sees derm 2 x per wyear.  . Chickenpox   . Migraine    as teen young adult hormonal  . Osteopenia    Dexa Scan per gyne  . Scoliosis   . Seasonal allergies   . Uterine polyp     Family History  Problem Relation Age of Onset  . Cancer Mother        Squamous cell cancer  . Coronary artery disease Father        died in 106s  had  pvd   . Hyperlipidemia Father   . Hypertension Father   . Pulmonary embolism Brother        eval[facility]at[facility] at [facility] for hypercoag state   . Cancer Maternal Grandmother        Colon cancer  . Cancer Maternal Grandfather        Colon cancer  . Down syndrome Son     Social History   Social History  . Marital status: Married    Spouse name: N/A  . Number of children: 3  . Years of education: N/A   Occupational History  . RN     Retired   Social History Main Topics  . Smoking status: Never Smoker  . Smokeless tobacco: Never Used  . Alcohol use Yes  . Drug use: Unknown  . Sexual activity: Not Asked   Other Topics Concern  . None   Social History Narrative   Married    HH of 3    RN Masters level retired Therapist, occupational   No pets    G3P3   Soc etoh no tobacco          Outpatient Medications Prior to Visit  Medication Sig Dispense Refill  . cetirizine (ZYRTEC) 10 MG tablet Take 10 mg by mouth daily.    . cholecalciferol (VITAMIN D) 1000 UNITS tablet Take 1,000 Units by mouth daily.     No  facility-administered medications prior to visit.      EXAM:  BP 110/64 (BP Location: Right Arm, Patient Position: Sitting, Cuff Size: Normal)   Pulse 71   Temp 98.2 F (36.8 C) (Oral)   Ht 5' 4.75" (1.645 m)   Wt 127 lb 12.8 oz (58 kg)   LMP 08/07/2011   BMI 21.43 kg/m   Body mass index is 21.43 kg/m.  GENERAL: vitals reviewed and listed above, alert, oriented, appears well hydrated and in no acute distress skin area left abdomin  2 mm erythematous and bruised area .  No fb seen .  PSYCH: pleasant and cooperative, no obvious depression or anxiety  ASSESSMENT AND PLAN:  Discussed the following assessment and plan:  Tick bite of abdomen, initial encounter Low risk for disease transmission however  Per request   Risk benefit of medication discussed. And sternocleidomastoid muscle  rx for doxy   Alert for signs of  Tick related diseases and  fu if needed.   Expectant management.   -Patient advised to return or notify health care team  if symptoms worsen ,persist or new concerns arise.  Patient Instructions  I believe your risk of tick related diseases low as attachment is most likely less than 72 hours among other factors. However we will go ahead and send in tick bite prophylaxis for prevention of Lyme disease.   below is Upto day  Summary   We suggest that patients who meet all criteria for antibiotic prophylaxis be offered a single dose of doxycycline (if the agent is not contraindicated) (Grade 2B). The criteria for prophylaxis include: .Attached tick identified as an adult or nymphal Ixodes scapularis tick (deer tick) .Tick is estimated to have been attached for ?36 hours (by degree of engorgement or time of exposure) .Prophylaxis is begun within 72 hours of tick removal .Local rate of infection of ticks with B. burgdorferi is ?20 percent (these rates of infection have been shown to occur in parts of [place], parts of the [place], parts of [place] and  [place]) .Doxycycline is not contraindicated (ie, the patient is not <70 years of age, pregnant, or lactating) (see 'Approach to prophylaxis' above)  Fu if fever rashes   Etc      Mitchelle Goerner K. Tayo Maute M.D.

## 2017-03-12 NOTE — Patient Instructions (Addendum)
I believe your risk of tick related diseases low as attachment is most likely less than 72 hours among other factors. However we will go ahead and send in tick bite prophylaxis for prevention of Lyme disease.   below is Upto day  Summary   We suggest that patients who meet all criteria for antibiotic prophylaxis be offered a single dose of doxycycline (if the agent is not contraindicated) (Grade 2B). The criteria for prophylaxis include: .Attached tick identified as an adult or nymphal Ixodes scapularis tick (deer tick) .Tick is estimated to have been attached for ?36 hours (by degree of engorgement or time of exposure) .Prophylaxis is begun within 72 hours of tick removal .Local rate of infection of ticks with B. burgdorferi is ?20 percent (these rates of infection have been shown to occur in parts of [place], parts of the [place], parts of [place] and [place]) .Doxycycline is not contraindicated (ie, the patient is not <23 years of age, pregnant, or lactating) (see 'Approach to prophylaxis' above)  Fu if fever rashes   Etc

## 2017-04-02 LAB — HM COLONOSCOPY

## 2017-04-07 ENCOUNTER — Encounter: Payer: Self-pay | Admitting: Family Medicine

## 2017-04-28 ENCOUNTER — Ambulatory Visit (INDEPENDENT_AMBULATORY_CARE_PROVIDER_SITE_OTHER): Payer: BLUE CROSS/BLUE SHIELD

## 2017-04-28 DIAGNOSIS — Z23 Encounter for immunization: Secondary | ICD-10-CM | POA: Diagnosis not present

## 2017-07-10 ENCOUNTER — Encounter: Payer: Self-pay | Admitting: Internal Medicine

## 2017-07-14 ENCOUNTER — Ambulatory Visit: Payer: BLUE CROSS/BLUE SHIELD

## 2017-07-14 ENCOUNTER — Ambulatory Visit (INDEPENDENT_AMBULATORY_CARE_PROVIDER_SITE_OTHER): Payer: BLUE CROSS/BLUE SHIELD

## 2017-07-14 DIAGNOSIS — Z23 Encounter for immunization: Secondary | ICD-10-CM | POA: Diagnosis not present

## 2017-08-26 ENCOUNTER — Other Ambulatory Visit: Payer: Self-pay | Admitting: Obstetrics and Gynecology

## 2017-08-26 DIAGNOSIS — Z1231 Encounter for screening mammogram for malignant neoplasm of breast: Secondary | ICD-10-CM

## 2017-09-15 ENCOUNTER — Encounter: Payer: Self-pay | Admitting: Internal Medicine

## 2017-09-15 ENCOUNTER — Ambulatory Visit (INDEPENDENT_AMBULATORY_CARE_PROVIDER_SITE_OTHER): Payer: BLUE CROSS/BLUE SHIELD | Admitting: Internal Medicine

## 2017-09-15 VITALS — BP 124/58 | HR 85 | Temp 97.7°F | Wt 126.2 lb

## 2017-09-15 DIAGNOSIS — R51 Headache: Secondary | ICD-10-CM | POA: Diagnosis not present

## 2017-09-15 DIAGNOSIS — R519 Headache, unspecified: Secondary | ICD-10-CM

## 2017-09-15 LAB — CBC WITH DIFFERENTIAL/PLATELET
BASOS PCT: 0.2 % (ref 0.0–3.0)
Basophils Absolute: 0 10*3/uL (ref 0.0–0.1)
EOS PCT: 0 % (ref 0.0–5.0)
Eosinophils Absolute: 0 10*3/uL (ref 0.0–0.7)
HCT: 44.7 % (ref 36.0–46.0)
Hemoglobin: 14.9 g/dL (ref 12.0–15.0)
LYMPHS ABS: 0.4 10*3/uL — AB (ref 0.7–4.0)
Lymphocytes Relative: 5.4 % — ABNORMAL LOW (ref 12.0–46.0)
MCHC: 33.2 g/dL (ref 30.0–36.0)
MCV: 94.5 fl (ref 78.0–100.0)
MONO ABS: 0.1 10*3/uL (ref 0.1–1.0)
Monocytes Relative: 1.2 % — ABNORMAL LOW (ref 3.0–12.0)
NEUTROS ABS: 6.2 10*3/uL (ref 1.4–7.7)
Platelets: 250 10*3/uL (ref 150.0–400.0)
RBC: 4.74 Mil/uL (ref 3.87–5.11)
RDW: 13 % (ref 11.5–15.5)
WBC: 6.6 10*3/uL (ref 4.0–10.5)

## 2017-09-15 LAB — COMPREHENSIVE METABOLIC PANEL
ALT: 13 U/L (ref 0–35)
AST: 18 U/L (ref 0–37)
Albumin: 4.7 g/dL (ref 3.5–5.2)
Alkaline Phosphatase: 82 U/L (ref 39–117)
BILIRUBIN TOTAL: 0.4 mg/dL (ref 0.2–1.2)
BUN: 21 mg/dL (ref 6–23)
CALCIUM: 10 mg/dL (ref 8.4–10.5)
CO2: 27 meq/L (ref 19–32)
Chloride: 105 mEq/L (ref 96–112)
Creatinine, Ser: 0.64 mg/dL (ref 0.40–1.20)
GFR: 98.99 mL/min (ref >60.00)
GLUCOSE: 160 mg/dL — AB (ref 70–99)
POTASSIUM: 4.2 meq/L (ref 3.5–5.1)
Sodium: 141 mEq/L (ref 135–145)
Total Protein: 7.5 g/dL (ref 6.0–8.3)

## 2017-09-15 LAB — SEDIMENTATION RATE: SED RATE: 5 mm/h (ref 0–30)

## 2017-09-15 NOTE — Progress Notes (Addendum)
Chief Complaint  Patient presents with  . Facial Pain    left sided facial pain x several weeks. Pt states that she had a severe headache last night radiating down into jaws. Pt has done a Prednisone taper in the past which helped resolve the facial pain. Pt states that the pain today is in her pallette, left jaw and headache. Pt has a h/o migraines    HPI: Patricia Potts 66 y.o. sda since September has been having intermittent left jaw and mid facial pain with occasional paresthesias around the left face without motor problems or full headaches.  At some point her husband who is ENT gave her a course of prednisone which seemed to help it.  There are many things going on in life including a child's marriage a death in the family.  However in the last week increasing symptoms almost daily taking Tylenol or Advil Aleve. Yesterday began with left facial pain and crescendo increased to the "worst headache she is ever had".  To the point of tears almost went to the emergency room.  She described it as just a deep ache 10 out of 10 both sides of her head and the top without visual changes or hearing problems or motor changes.  Her husband started her back on a prednisone she took Advil Aleve and Tylenol and a couple cups of caffeine.  Now her headache is about a 5.  She has a remote history of classic migraines with aura when she was younger has not had very many since she was 39 and menopausal. Also past history of sudden left hearing loss when she was about 31 but her hearing has come back and at that time she did have some type of head scan. Otherwise no history of acute neurologic events or difficulties. She is here with her child today. No fever chills but did have a recent shingles vaccine.  No vision changes double visions weakness or numbness otherwise.  No CNS symptoms.  No balance or falling.  No serious neck pain although does radiate at times to the back of the neck ROS: See pertinent  positives and negatives per HPI.  Past Medical History:  Diagnosis Date  . Actinic keratosis    sees derm 2 x per wyear.  . Chickenpox   . Migraine    as teen young adult hormonal  . Osteopenia    Dexa Scan per gyne  . Scoliosis   . Seasonal allergies   . Uterine polyp     Family History  Problem Relation Age of Onset  . Cancer Mother        Squamous cell cancer  . Coronary artery disease Father        died in 36s  had  pvd   . Hyperlipidemia Father   . Hypertension Father   . Pulmonary embolism Brother        evaluatied at [facility] for hypercoag state   . Cancer Maternal Grandmother        Colon cancer  . Cancer Maternal Grandfather        Colon cancer  . Down syndrome Son     Social History   Socioeconomic History  . Marital status: Married    Spouse name: None  . Number of children: 3  . Years of education: None  . Highest education level: None  Social Needs  . Financial resource strain: None  . Food insecurity - worry: None  . Food insecurity - inability:  None  . Transportation needs - medical: None  . Transportation needs - non-medical: None  Occupational History  . Occupation: Therapist, sports    Comment: Retired  Tobacco Use  . Smoking status: Never Smoker  . Smokeless tobacco: Never Used  Substance and Sexual Activity  . Alcohol use: Yes  . Drug use: None  . Sexual activity: None  Other Topics Concern  . None  Social History Narrative   Married    HH of 3    RN Masters level retired Therapist, occupational   No pets    G3P3   Soc etoh no tobacco          Outpatient Medications Prior to Visit  Medication Sig Dispense Refill  . cetirizine (ZYRTEC) 10 MG tablet Take 10 mg by mouth daily.    . cholecalciferol (VITAMIN D) 1000 UNITS tablet Take 1,000 Units by mouth daily.    . predniSONE (DELTASONE) 20 MG tablet taper    . doxycycline (VIBRA-TABS) 100 MG tablet Take 2 tablets one time (Patient not taking: Reported on 09/15/2017) 2 tablet 0   No  facility-administered medications prior to visit.      EXAM:  BP (!) 124/58 (BP Location: Right Arm, Patient Position: Sitting, Cuff Size: Normal)   Pulse 85   Temp 97.7 F (36.5 C) (Oral)   Wt 126 lb 3.2 oz (57.2 kg)   LMP 08/07/2011   BMI 21.16 kg/m   Body mass index is 21.16 kg/m.  GENERAL: vitals reviewed and listed above, alert, oriented, appears well hydrated and in no acute distress HEENT: atraumatic, conjunctiva  clear, no obvious abnormalities on inspection of external nose and ears TMs are clear OP : no lesion edema or exudate face is symmetrical EOMs are full tongue is midline no unusual rashes. NECK: no obvious masses on inspection palpation  LUNGS: clear to auscultation bilaterally, no wheezes, rales or rhonchi, good air movement CV: HRRR, no clubbing cyanosis or  peripheral edema nl cap refill  MS: moves all extremities without noticeable focal  Abnormality NEURO: oriented x 3 CN 3-12 appear intact. No focal muscle weakness or atrophy. DTRs symmetrical. Gait WNL.  Grossly non focal. No tremor or abnormal movement.  Negative drift on Romberg good finger to nose heel to toe walking and gait is normal.    PSYCH: pleasant and cooperative, no obvious depression or anxiety   ASSESSMENT AND PLAN:  Discussed the following assessment and plan:  Left-sided face pain - Plan: CBC with Differential/Platelet, Sedimentation rate, CMP, CANCELED: Basic metabolic panel  New onset headache - Plan: CBC with Differential/Platelet, Sedimentation rate, CMP, CANCELED: Basic metabolic panel  Acute nonintractable headache, unspecified headache type - Plan: MR Brain W Wo Contrast, CMP Nonfocal exam is reassuring but severity is new.  Possibly atypical facial pain trigeminal neuralgia that possibly triggered a migraine?. Plan imaging lab work can take 2 Aleve twice a day and finish the prednisone since she is better than last night MRI scan stat we will plan neurologic referral consult.   Doubt  Inflammatory cause    Expectant management.  -Patient advised to return or notify health care team  if symptoms worsen ,persist or new concerns arise.  Patient Instructions  Ok to take the  Prednisone . Mri ordered stat  And blood tests today  Poss that you have facial pain syndrome that is triggering  A migraine    Expect  To have you see neurology also    Let us know preferred   Referral.  May benefit from medication for Trigemical neuralgia syndrome  Seek emergent care if getting worse  Or neurologic symptoms     Standley Brooking. Panosh M.D.  See MRI report discussed with Dr. Thornell Mule assessment that sphenoid sinusitis as the cause of her symptoms. She is some better on the prednisone Sending in Levaquin 751 p.o. daily times 10 days and she will see Dr. Wilburn Cornelia ENT  in the office December 10. We will not proceed with neurology referral at this time.  FINDINGS: Brain: No acute infarction, hemorrhage, hydrocephalus, extra-axial collection or mass lesion. Few nonspecific scattered punctate foci of T2 FLAIR hyperintense signal abnormality are present in centrum semiovale of frontal and parietal lobes. 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 Right hemi pons faint blush of susceptibility hypointensity and enhancement without signal abnormality on other sequences compatible with capillary telangiectasia. No additional focus of abnormal enhancement.  Vascular: Normal flow voids.  Skull and upper cervical spine: Normal marrow signal.  Sinuses/Orbits: Mild ethmoid sinus mucosal thickening and opacification of left sphenoid sinus. No abnormal signal of mastoid air cells.  Other: None.  IMPRESSION: 1. No acute intracranial abnormality. 2. Few nonspecific foci of T2 FLAIR hyperintense signal abnormality in white matter probably represent minimal chronic microvascular ischemic changes or could be seen associated with migraine headache. 3. 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   Electronically Signed   By: Kristine Garbe M.D.   On: 09/16/2017 13:47

## 2017-09-15 NOTE — Patient Instructions (Addendum)
Ok to take the  Prednisone . Mri ordered stat  And blood tests today  Poss that you have facial pain syndrome that is triggering  A migraine    Expect  To have you see neurology also    Let us know preferred   Referral.   May benefit from medication for Trigemical neuralgia syndrome  Seek emergent care if getting worse  Or neurologic symptoms

## 2017-09-15 NOTE — Telephone Encounter (Signed)
Sounds great!

## 2017-09-15 NOTE — Telephone Encounter (Signed)
Please see e-mail. Pt was able to get MRI moved to tomorrow noon at GI.

## 2017-09-16 ENCOUNTER — Ambulatory Visit
Admission: RE | Admit: 2017-09-16 | Discharge: 2017-09-16 | Disposition: A | Discharge status: Home / Self Care | Payer: BLUE CROSS/BLUE SHIELD | Source: Ambulatory Visit | Attending: Internal Medicine | Admitting: Internal Medicine

## 2017-09-16 ENCOUNTER — Ambulatory Visit (HOSPITAL_COMMUNITY): Payer: BLUE CROSS/BLUE SHIELD

## 2017-09-16 DIAGNOSIS — R51 Headache: Principal | ICD-10-CM

## 2017-09-16 DIAGNOSIS — R519 Headache, unspecified: Secondary | ICD-10-CM

## 2017-09-16 MED ORDER — GADOBENATE DIMEGLUMINE 529 MG/ML IV SOLN
11.0000 mL | Freq: Once | INTRAVENOUS | Status: AC | PRN
  Administered 2017-09-16: 11 mL via INTRAVENOUS

## 2017-09-17 MED ORDER — LEVOFLOXACIN 750 MG PO TABS: 750.0000 mg | ORAL_TABLET | Freq: Every day | ORAL | 0 refills | Status: AC

## 2017-09-17 NOTE — Addendum Note (Signed)
Addended byBurnis Medin on: 09/17/2017 08:43 AM   Modules accepted: Orders

## 2017-09-29 ENCOUNTER — Ambulatory Visit: Payer: BLUE CROSS/BLUE SHIELD

## 2017-10-29 ENCOUNTER — Ambulatory Visit
Admission: RE | Admit: 2017-10-29 | Discharge: 2017-10-29 | Disposition: A | Discharge status: Home / Self Care | Payer: Medicare Other | Source: Ambulatory Visit | Attending: Obstetrics and Gynecology | Admitting: Obstetrics and Gynecology

## 2017-10-29 DIAGNOSIS — Z1231 Encounter for screening mammogram for malignant neoplasm of breast: Secondary | ICD-10-CM

## 2017-12-28 ENCOUNTER — Encounter (HOSPITAL_COMMUNITY): Payer: Self-pay | Admitting: *Deleted

## 2017-12-28 ENCOUNTER — Other Ambulatory Visit: Payer: Self-pay

## 2017-12-28 ENCOUNTER — Emergency Department (HOSPITAL_COMMUNITY)
Admission: EM | Admit: 2017-12-28 | Discharge: 2017-12-28 | Disposition: A | Discharge status: Home / Self Care | Payer: Medicare Other | Attending: Emergency Medicine | Admitting: Emergency Medicine

## 2017-12-28 ENCOUNTER — Emergency Department (HOSPITAL_COMMUNITY): Payer: Medicare Other

## 2017-12-28 DIAGNOSIS — R1012 Left upper quadrant pain: Secondary | ICD-10-CM | POA: Insufficient documentation

## 2017-12-28 DIAGNOSIS — R1013 Epigastric pain: Secondary | ICD-10-CM | POA: Diagnosis not present

## 2017-12-28 LAB — COMPREHENSIVE METABOLIC PANEL
ALBUMIN: 4 g/dL (ref 3.5–5.0)
ALT: 16 U/L (ref 14–54)
ANION GAP: 11 (ref 5–15)
AST: 21 U/L (ref 15–41)
Alkaline Phosphatase: 69 U/L (ref 38–126)
BUN: 13 mg/dL (ref 6–20)
CO2: 23 mmol/L (ref 22–32)
Calcium: 9.2 mg/dL (ref 8.9–10.3)
Chloride: 104 mmol/L (ref 101–111)
Creatinine, Ser: 0.72 mg/dL (ref 0.44–1.00)
GFR calc Af Amer: >60 mL/min (ref >60)
GFR calc non Af Amer: >60 mL/min (ref >60)
Glucose, Bld: 115 mg/dL — ABNORMAL HIGH (ref 65–99)
POTASSIUM: 4 mmol/L (ref 3.5–5.1)
SODIUM: 138 mmol/L (ref 135–145)
Total Bilirubin: 0.9 mg/dL (ref 0.3–1.2)
Total Protein: 6.9 g/dL (ref 6.5–8.1)

## 2017-12-28 LAB — CBC
HEMATOCRIT: 40.9 % (ref 36.0–46.0)
HEMOGLOBIN: 13.7 g/dL (ref 12.0–15.0)
MCH: 31.5 pg (ref 26.0–34.0)
MCHC: 33.5 g/dL (ref 30.0–36.0)
MCV: 94 fL (ref 78.0–100.0)
Platelets: 219 10*3/uL (ref 150–400)
RBC: 4.35 MIL/uL (ref 3.87–5.11)
RDW: 12.3 % (ref 11.5–15.5)
WBC: 10.3 10*3/uL (ref 4.0–10.5)

## 2017-12-28 LAB — URINALYSIS, ROUTINE W REFLEX MICROSCOPIC
BILIRUBIN URINE: NEGATIVE
GLUCOSE, UA: NEGATIVE mg/dL
HGB URINE DIPSTICK: NEGATIVE
Ketones, ur: NEGATIVE mg/dL
Leukocytes, UA: NEGATIVE
NITRITE: NEGATIVE
PH: 8 (ref 5.0–8.0)
Protein, ur: NEGATIVE mg/dL
SPECIFIC GRAVITY, URINE: 1.016 (ref 1.005–1.030)

## 2017-12-28 LAB — TROPONIN I: Troponin I: <0.03 ng/mL (ref <0.03)

## 2017-12-28 LAB — LIPASE, BLOOD: Lipase: 27 U/L (ref 11–51)

## 2017-12-28 MED ORDER — IOPAMIDOL (ISOVUE-300) INJECTION 61%
INTRAVENOUS | Status: AC
  Filled 2017-12-28: qty 100

## 2017-12-28 MED ORDER — IOPAMIDOL (ISOVUE-300) INJECTION 61%
INTRAVENOUS | Status: AC
  Administered 2017-12-28: 100 mL
  Filled 2017-12-28: qty 30

## 2017-12-28 MED ORDER — PANTOPRAZOLE SODIUM 20 MG PO TBEC
20.0000 mg | DELAYED_RELEASE_TABLET | Freq: Once | ORAL | Status: AC
  Administered 2017-12-28: 20 mg via ORAL
  Filled 2017-12-28 (×2): qty 1

## 2017-12-28 MED ORDER — PANTOPRAZOLE SODIUM 20 MG PO TBEC: 20.0000 mg | DELAYED_RELEASE_TABLET | Freq: Every day | ORAL | 0 refills | Status: DC

## 2017-12-28 MED ORDER — SODIUM CHLORIDE 0.9 % IV BOLUS (SEPSIS)
1000.0000 mL | Freq: Once | INTRAVENOUS | Status: AC
  Administered 2017-12-28: 1000 mL via INTRAVENOUS

## 2017-12-28 MED ORDER — POLYETHYLENE GLYCOL 3350 17 G PO PACK: 17.0000 g | PACK | Freq: Every day | ORAL | 0 refills | Status: DC

## 2017-12-28 MED ORDER — DICYCLOMINE HCL 10 MG PO CAPS
10.0000 mg | ORAL_CAPSULE | Freq: Once | ORAL | Status: AC
  Administered 2017-12-28: 10 mg via ORAL
  Filled 2017-12-28: qty 1

## 2017-12-28 MED ORDER — DICYCLOMINE HCL 20 MG PO TABS: 20.0000 mg | ORAL_TABLET | Freq: Three times a day (TID) | ORAL | 0 refills | Status: DC | PRN

## 2017-12-28 NOTE — Discharge Instructions (Signed)
1.  For the next 2 weeks, take Protonix daily.  Avoid all NSAID type medications.  Follow-up with gastroenterology for evaluation for upper endoscopy. 2.  Follow low-fat and bland diet.  No abnormality has been identified in your gallbladder by CT scan.  If symptoms persist or are triggered by food, you may need a HIDA scan for additional evaluation of the gallbladder contraction pattern. 3.  Check twice daily for the next 3-5 days for any blister like rash in your area of pain.  If this appears, call your doctor to start medication for shingles. 4.  You have been prescribed Bentyl to take for episodes of spasmodic pain.  If pain worsens, persists or new symptoms develop, return to the emergency department. 5.  No acute etiologies have been identified on your CT scan to explain your pain episode.  CT does show moderate amount of stool. Although your history does not suggest significant constipation, try MiraLAX for the next 3 days until you have a daily bowel movement.

## 2017-12-28 NOTE — ED Provider Notes (Signed)
[facility] EMERGENCY DEPARTMENT Provider Note   CSN: [id number] Arrival date & time: 12/28/17  0524     History   Chief Complaint Chief Complaint  Patient presents with  . Abdominal Pain    HPI Patricia Potts is a 67 y.o. female.  HPI  Patient is a 67 year old female with a history of osteopenia, allergic rhinitis, and an abdominal surgical history significant for 3 cesarean sections and tubal ligation presenting for left upper quadrant epigastric pain.  Patient reports that she had a sudden, sharp episode of pain after eating a mango yesterday afternoon.  Patient reports that she was doubled over on the floor due to the pain, and that the episode resolved spontaneously.  Patient denies nausea or vomiting at this time.  Patient reports she had a normal bowel movement for yesterday without melena or hematochezia, and she has passed flatus today.  Patient reports that the episode recurred approximately 3 AM this morning, and again 10 minutes later.  Patient denies any further episodes since, but reports that her epigastrium feels "sore" in a bandlike pattern.  Patient denies any fevers, chills, chest pain, shortness of breath, nausea or vomiting today.  Patient denies any dysuria, flank pain, urgency, or frequency.  Patient denies any history of nephrolithiasis, biliary colic, or peptic ulcer disease.  Patient reports he does take daily NSAIDs some weeks due to low back pain.  Patient denies coughing, lower extremity edema, recent immobilization, hospitalization, estrogen use, or surgery.  Patient does report a history in the fall of daily NSAID use.  Past Medical History:  Diagnosis Date  . Actinic keratosis    sees derm 2 x per wyear.  . Chickenpox   . Migraine    as teen young adult hormonal  . Osteopenia    Dexa Scan per gyne  . Scoliosis   . Seasonal allergies   . Uterine polyp     Patient Active Problem List   Diagnosis Date Noted  . Osteopenia   .  Actinic keratosis   . ALLERGIC RHINITIS 08/14/2010    Past Surgical History:  Procedure Laterality Date  . CESAREAN SECTION  [phone]  . Mucoid Cysts     Removed from both hands and fingers sypher   . polyp     uterine removal   . TUBAL LIGATION  1989    OB History    Gravida Para Term Preterm AB Living   3 3           SAB TAB Ectopic Multiple Live Births                   Home Medications    Prior to Admission medications   Medication Sig Start Date End Date Taking? Authorizing Provider  cetirizine (ZYRTEC) 10 MG tablet Take 10 mg by mouth daily.    [provider]  cholecalciferol (VITAMIN D) 1000 UNITS tablet Take 1,000 Units by mouth daily.    [provider]  predniSONE (DELTASONE) 20 MG tablet taper    [provider]    Family History Family History  Problem Relation Age of Onset  . Cancer Mother        Squamous cell cancer  . Coronary artery disease Father        died in 33s  had  pvd   . Hyperlipidemia Father   . Hypertension Father   . Pulmonary embolism Brother        evaluatied at [facility] for hypercoag  state   . Cancer Maternal Grandmother        Colon cancer  . Cancer Maternal Grandfather        Colon cancer  . Down syndrome Son   . Breast cancer Neg Hx     Social History Social History   Tobacco Use  . Smoking status: Never Smoker  . Smokeless tobacco: Never Used  Substance Use Topics  . Alcohol use: Yes  . Drug use: Not on file     Allergies   Sulfonamide derivatives   Review of Systems Review of Systems  Constitutional: Negative for chills and fever.  HENT: Negative for congestion and rhinorrhea.   Eyes: Negative for visual disturbance.  Respiratory: Negative for cough, chest tightness and shortness of breath.   Cardiovascular: Negative for chest pain, palpitations and leg swelling.  Gastrointestinal: Positive for abdominal pain. Negative for constipation, diarrhea, nausea and vomiting.    Genitourinary: Negative for dysuria, flank pain, frequency and urgency.  Musculoskeletal: Negative for back pain and myalgias.  Skin: Negative for rash.  Neurological: Negative for syncope.     Physical Exam Updated Vital Signs BP 113/65 (BP Location: Right Arm)   Pulse 76   Temp 98.1 F (36.7 C) (Oral)   Resp 16   Ht 5\' 5"  (1.651 m)   Wt 56.7 kg (125 lb)   LMP 08/07/2011   SpO2 100%   BMI 20.80 kg/m   Physical Exam  Constitutional: She appears well-developed and well-nourished. No distress.  HENT:  Head: Normocephalic and atraumatic.  Mouth/Throat: Oropharynx is clear and moist.  Eyes: Conjunctivae and EOM are normal. Pupils are equal, round, and reactive to light.  Neck: Normal range of motion. Neck supple.  Cardiovascular: Normal rate, regular rhythm, S1 normal and S2 normal.  No murmur heard. Pulmonary/Chest: Effort normal and breath sounds normal. She has no wheezes. She has no rales.  Abdominal: Soft. Normal appearance and bowel sounds are normal. She exhibits no distension. There is tenderness in the epigastric area. There is no rebound, no guarding and no CVA tenderness.  Well healed surgical scars.  Musculoskeletal: Normal range of motion. She exhibits no edema or deformity.  Lymphadenopathy:    She has no cervical adenopathy.  Neurological: She is alert.  Cranial nerves grossly intact. Patient moves extremities symmetrically and with good coordination.  Skin: Skin is warm and dry. No rash noted. No erythema.  Psychiatric: She has a normal mood and affect. Her behavior is normal. Judgment and thought content normal.  Nursing note and vitals reviewed.    ED Treatments / Results  Labs (all labs ordered are listed, but only abnormal results are displayed) Labs Reviewed  COMPREHENSIVE METABOLIC PANEL - Abnormal; Notable for the following components:      Result Value   Glucose, Bld 115 (*)    All other components within normal limits  LIPASE, BLOOD  CBC   URINALYSIS, ROUTINE W REFLEX MICROSCOPIC  TROPONIN I    EKG  EKG Interpretation None       Radiology No results found.  Procedures Procedures (including critical care time)  Medications Ordered in ED Medications  sodium chloride 0.9 % bolus 1,000 mL (not administered)     Initial Impression / Assessment and Plan / ED Course  I have reviewed the triage vital signs and the nursing notes.  Pertinent labs & imaging results that were available during my care of the patient were reviewed by me and considered in my medical decision making (see chart for  details).  Clinical Course as of Dec 28 948  Mon Dec 28, 2017  3664 Patient declining analgesia or antiemetics at this time.  [AM]  [phone] On reexamination, patient feeling well with no evidence of recurrence of the colicky pain she experienced earlier today.  Patient stable for discharge, and CT abdomen pelvis shows no acute findings.  Discussed with patient that she is likely to require follow-up with gastroenterology for upper endoscopy.  [AM]    Clinical Course User Index [AM] Albesa Seen, PA-C   Patient is nontoxic-appearing, afebrile, hemodynamically stable, and without pain at this time.  Differential diagnosis includes small bowel obstruction, pancreatitis, peptic ulcer disease, gastritis, gastroenteritis, cholecystitis,  cholelithiasis, choledocholithiasis, ACS, pneumonia, pyelonephritis.  Will obtain CT abdomen and pelvis with both IV and oral contrast to evaluate upper GI tract.  Case discussed with Dr. Charlesetta Shanks.  Lab work is significant only for slightly elevated glucose at 115.  I discussed with the patient, and as it was a fasting value she states she will follow-up with her PCP.  Chest x-ray per baseline with granuloma in the right apex.  No evidence of acute findings on CT scan abdomen pelvis.  Patient to be treated symptomatically for gastritis and close follow-up with gastroenterology for endoscopy.   Patient discharged per Dr. Johnney Killian.   Reasons to return discussed with patient.Patient is understanding agrees with plan of care.   This is a shared visit with Dr. Charlesetta Shanks. Patient was independently evaluated by this attending physician. Attending physician consulted in evaluation and discharge management.   Final Clinical Impressions(s) / ED Diagnoses   Final diagnoses:  Left upper quadrant pain    ED Discharge Orders        Ordered    pantoprazole (PROTONIX) 20 MG tablet  Daily     12/28/17 0941    dicyclomine (BENTYL) 20 MG tablet  3 times daily PRN     12/28/17 0941    polyethylene glycol (MIRALAX / GLYCOLAX) packet  Daily     12/28/17 0941       Albesa Seen, PA-C 12/28/17 1016    Charlesetta Shanks, MD 01/01/18 [phone]

## 2017-12-28 NOTE — ED Provider Notes (Signed)
Medical screening examination/treatment/procedure(s) were conducted as a shared visit with non-physician practitioner(s) and myself.  I personally evaluated the patient during the encounter.   EKG Interpretation None     Patient developed an acute and severe upper abdominal pain.  This is mostly on the left.  It did begin to spontaneously improve and become tolerable.  Patient did not develop any vomiting or diarrhea.  She did have one normal bowel movement yesterday without any blood or pain.  Patient reports that 3 AM the pain came back again and was extremely severe.  It created a pain that was residual and an aching bandlike pattern on the left side and anterior abdomen.  She did not develop significant associated symptoms.  Patient takes NSAIDs about 3 times per week for low back pain.  She has otherwise been well, at baseline is healthy and active.  On exam patient is alert and nontoxic.  Heart and lungs normal.  Abdomen soft with moderate epigastric and left upper quadrant discomfort to palpation.  No guarding.  No appreciable mass.  No lower extremity edema.  Skin is warm and dry.   Diagnostic workup does not identify specific etiology.  CT without acute findings.  Pain quality and severity is suggestive of peptic ulcer disease or gastritis.  Patient does use NSAIDs with some regularity but not high frequency.  Plan at this time is to discontinue NSAIDs start Protonix daily.  CT indicates moderate stool burden, will trial MiraLAX.  Patient also counseled on possibility of shingles.  At this time there is no rash present but she is to observe for any possible development of vesicular rash and seek treatment immediately if develops.  Plan will be for follow-up with gastroenterology.   Charlesetta Shanks, MD 12/31/17 [phone]

## 2017-12-28 NOTE — ED Triage Notes (Signed)
The pt is c/o abd pain since yesterday  That went away and returned this am more severe  No n v or diarrhea

## 2018-09-27 ENCOUNTER — Other Ambulatory Visit: Payer: Self-pay | Admitting: Obstetrics and Gynecology

## 2018-09-27 DIAGNOSIS — Z1231 Encounter for screening mammogram for malignant neoplasm of breast: Secondary | ICD-10-CM

## 2018-11-08 ENCOUNTER — Ambulatory Visit
Admission: RE | Admit: 2018-11-08 | Discharge: 2018-11-08 | Disposition: A | Discharge status: Home / Self Care | Payer: Medicare Other | Source: Ambulatory Visit | Attending: Obstetrics and Gynecology | Admitting: Obstetrics and Gynecology

## 2018-11-08 DIAGNOSIS — Z1231 Encounter for screening mammogram for malignant neoplasm of breast: Secondary | ICD-10-CM

## 2019-10-10 ENCOUNTER — Other Ambulatory Visit: Payer: Self-pay | Admitting: Obstetrics and Gynecology

## 2019-10-10 DIAGNOSIS — Z1231 Encounter for screening mammogram for malignant neoplasm of breast: Secondary | ICD-10-CM

## 2019-11-20 ENCOUNTER — Ambulatory Visit: Payer: Medicare Other

## 2019-11-25 ENCOUNTER — Other Ambulatory Visit: Payer: Self-pay

## 2019-11-25 ENCOUNTER — Ambulatory Visit
Admission: RE | Admit: 2019-11-25 | Discharge: 2019-11-25 | Disposition: A | Discharge status: Home / Self Care | Payer: Medicare Other | Source: Ambulatory Visit | Attending: Obstetrics and Gynecology | Admitting: Obstetrics and Gynecology

## 2019-11-25 DIAGNOSIS — Z1231 Encounter for screening mammogram for malignant neoplasm of breast: Secondary | ICD-10-CM

## 2019-11-27 ENCOUNTER — Ambulatory Visit: Payer: Medicare Other

## 2019-12-01 ENCOUNTER — Ambulatory Visit: Payer: Medicare Other

## 2020-09-03 NOTE — Progress Notes (Signed)
Chief Complaint  Patient presents with   Annual Exam    left hip pain getting worse    HPI: Patricia Potts 69 y.o. comes in today for yearly visit  .Since last visit. Doing ok except  Progressing  Left hip pain      Ramos ; Injection has helped   And last  1.8   On going .   Had steroid flare ,   Hard to sleep .   OA    prob dx  Remote   Mri.   Some back   Issues .  But   ocass aleve.    Considering   rheumatology .orother   Evaluation  Possible  Had  ssca     Zygoma.    Amy [name]  Allergies   Has had large reaction to     To local bites    Stepped yellow jacket s    Profound reaction    Had worse after   Health Maintenance  Topic Date Due   DEXA SCAN  Never done   PNA vac Low Risk Adult (2 of 2 - PCV13) 10/23/2020   MAMMOGRAM  11/24/2021   COLONOSCOPY  04/03/2027   TETANUS/TDAP  10/23/2029   INFLUENZA VACCINE  Completed   COVID-19 Vaccine  Completed   Hepatitis C Screening  Completed   Health Maintenance Review LIFESTYLE:  Exercise:   Hip  Limited but doing well with walking  A mile  Tobacco/ETS:n Alcohol:   ocass   4 /weekSugar beverages: Sleep:  6-7 hours   Drug use: no HH:  3   ROS:  GEN/ HEENT: No fever, significant weight changes sweats headaches vision problems hearing changes, CV/ PULM; No chest pain shortness of breath cough, syncope,edema  change in exercise tolerance. GI /GU: No adominal pain, vomiting, change in bowel habits. No blood in the stool. No significant GU symptoms. SKIN/HEME: ,no acute skin rashes suspicious lesions or bleeding. No lymphadenopathy, nodules, masses.  NEURO/ PSYCH:  No neurologic signs such as weakness numbness. No depression anxiety. IMM/ Allergy: No unusual infections.  Allergy .   REST of 12 system review negative except as per HPI   Past Medical History:  Diagnosis Date   Actinic keratosis    sees derm 2 x per wyear.   Chickenpox    Migraine    as teen young adult hormonal   Osteopenia    Dexa Scan per gyne    SCCA (squamous cell carcinoma) of skin    Scoliosis    Seasonal allergies    Uterine polyp     Family History  Problem Relation Age of Onset   Cancer Mother        Squamous cell cancer   Coronary artery disease Father        died in 19s  had  pvd    Hyperlipidemia Father    Hypertension Father    Pulmonary embolism Brother        evaluatied at [facility] for hypercoag state    Cancer Maternal Grandmother        Colon cancer   Cancer Maternal Grandfather        Colon cancer   Down syndrome Son    Breast cancer Neg Hx     Social History   Socioeconomic History   Marital status: Married    Spouse name: Not on file   Number of children: 3   Years of education: Not on file   Highest education level: Not  on file  Occupational History   Occupation: Therapist, sports    Comment: Retired  Tobacco Use   Smoking status: Never Smoker   Smokeless tobacco: Never Used  Substance and Sexual Activity   Alcohol use: Yes   Drug use: Not on file   Sexual activity: Not on file  Other Topics Concern   Not on file  Social History Narrative   Married    [place] 3    RN Masters level retired homemaker Physicist, medical   No pets    G3P3   Soc etoh no tobacco         Social Determinants of Radio broadcast assistant Strain:    Difficulty of Paying Living Expenses: Not on file  Food Insecurity:    Worried About Charity fundraiser in the Last Year: Not on file   [place] in the Last Year: Not on file  Transportation Needs:    Film/video editor (Medical): Not on file   Lack of Transportation (Non-Medical): Not on file  Physical Activity:    Days of Exercise per Week: Not on file   Minutes of Exercise per Session: Not on file  Stress:    Feeling of Stress : Not on file  Social Connections:    Frequency of Communication with Friends and Family: Not on file   Frequency of Social Gatherings with Friends and Family: Not on file   Attends Religious Services:  Not on file   Active Member of Clubs or Organizations: Not on file   Attends Club or Organization Meetings: Not on file   Marital Status: Not on file    Outpatient Encounter Medications as of 09/04/2020  Medication Sig   calcium carbonate (OSCAL) 1500 (600 Ca) MG TABS tablet Take by mouth 2 (two) times daily with a meal.   cetirizine (ZYRTEC) 10 MG tablet Take 10 mg by mouth daily.   Multiple Vitamins-Minerals (CENTRUM SILVER PO) Take by mouth.   polyethylene glycol (MIRALAX / GLYCOLAX) packet Take 17 g by mouth daily.   predniSONE (DELTASONE) 20 MG tablet taper   [DISCONTINUED] cholecalciferol (VITAMIN D) 1000 UNITS tablet Take 1,000 Units by mouth daily.   dicyclomine (BENTYL) 20 MG tablet Take 1 tablet (20 mg total) by mouth 3 (three) times daily as needed for spasms. (Patient not taking: Reported on 09/04/2020)   pantoprazole (PROTONIX) 20 MG tablet Take 1 tablet (20 mg total) by mouth daily. (Patient not taking: Reported on 09/04/2020)   No facility-administered encounter medications on file as of 09/04/2020.    EXAM:  BP 140/74    Pulse 78    Temp 98.1 F (36.7 C) (Oral)    Ht 5\' 5"  (1.651 m)    Wt 126 lb 9.6 oz (57.4 kg)    LMP 08/07/2011 Comment: tubal ligation   SpO2 99%    BMI 21.07 kg/m   Body mass index is 21.07 kg/m.  Physical Exam: Vital signs reviewed QIO:NGEX is a well-developed well-nourished alert cooperative   who appears stated age in no acute distress.  HEENT: normocephalic atraumatic , Eyes: PERRL EOM's full, conjunctiva clear, Nares: paten,t no deformity discharge or tenderness., Ears: no deformity EAC's clear TMs with normal landmarks. Mouth: masked  NECK: supple without masses, thyromegaly or bruits. CHEST/PULM:  Clear to auscultation and percussion breath sounds equal no wheeze , rales or rhonchi. No chest wall deformities or tenderness. CV: PMI is nondisplaced, S1 S2 no gallops, murmurs, rubs. Peripheral pulses are full without delay.No  JVD .    ABDOMEN: Bowel sounds normal nontender  No guard or rebound, no hepato splenomegal no CVA tenderness.   Extremtities:  No clubbing cyanosis or edema, no acute joint swelling or redness no focal atrophy  right thigh and but gait  Ok  NEURO:  Oriented x3, cranial nerves 3-12 appear to be intact, no obvious focal weakness,gait within normal limits no abnormal reflexes or asymmetrical SKIN: No acute rashes normal turgor, color, no bruising or petechiae. PSYCH: Oriented, good eye contact, no obvious depression anxiety, cognition and judgment appear normal. LN: no cervical axillary inguinal adenopathy No noted deficits in memory, attention, and speech.   Lab Results  Component Value Date   WBC 5.1 09/04/2020   HGB 14.0 09/04/2020   HCT 41.3 09/04/2020   PLT 224 09/04/2020   GLUCOSE 95 09/04/2020   CHOL 194 09/04/2020   TRIG 83 09/04/2020   HDL 78 09/04/2020   LDLCALC 99 09/04/2020   ALT 13 09/04/2020   AST 21 09/04/2020   NA 140 09/04/2020   K 4.4 09/04/2020   CL 103 09/04/2020   CREATININE 0.76 09/04/2020   BUN 18 09/04/2020   CO2 29 09/04/2020   TSH 0.77 10/15/2016    ASSESSMENT AND PLAN:  Discussed the following assessment and plan:  Left hip pain  Osteopenia, unspecified location - Plan: CBC with Differential/Platelet, BASIC METABOLIC PANEL WITH GFR, Lipid panel, Hepatic function panel, C-reactive protein, C-reactive protein, Hepatic function panel, Lipid panel, BASIC METABOLIC PANEL WITH GFR, CBC with Differential/Platelet  Medication management - Plan: CBC with Differential/Platelet, BASIC METABOLIC PANEL WITH GFR, Lipid panel, Hepatic function panel, C-reactive protein, C-reactive protein, Hepatic function panel, Lipid panel, BASIC METABOLIC PANEL WITH GFR, CBC with Differential/Platelet  Elevated LDL cholesterol level - Plan: CBC with Differential/Platelet, BASIC METABOLIC PANEL WITH GFR, Lipid panel, Hepatic function panel, C-reactive protein, C-reactive protein, Hepatic  function panel, Lipid panel, BASIC METABOLIC PANEL WITH GFR, CBC with Differential/Platelet 32 mintes review counsel and  Plan  monitoring  Patient Care Team: Aren Cherne, Standley Brooking, MD as PCP - General Dian Queen, MD as Attending Physician (Obstetrics and Gynecology) [name], Amy, MD as Consulting Physician (Dermatology) Sharyne Peach, MD as Attending Physician (Ophthalmology) Suella Broad, MD as Consulting Physician (Physical Medicine and Rehabilitation)  Patient Instructions  See ortho team first about hip. Will notify you  of labs when available.   Continue lifestyle intervention healthy eating and exercise .    Health Maintenance, Female Adopting a healthy lifestyle and getting preventive care are important in promoting health and wellness. Ask your health care provider about:  The right schedule for you to have regular tests and exams.  Things you can do on your own to prevent diseases and keep yourself healthy. What should I know about diet, weight, and exercise? Eat a healthy diet   Eat a diet that includes plenty of vegetables, fruits, low-fat dairy products, and lean protein.  Do not eat a lot of foods that are high in solid fats, added sugars, or sodium. Maintain a healthy weight Body mass index (BMI) is used to identify weight problems. It estimates body fat based on height and weight. Your health care provider can help determine your BMI and help you achieve or maintain a healthy weight. Get regular exercise Get regular exercise. This is one of the most important things you can do for your health. Most adults should:  Exercise for at least 150 minutes each week. The exercise should increase your heart rate and make  you sweat (moderate-intensity exercise).  Do strengthening exercises at least twice a week. This is in addition to the moderate-intensity exercise.  Spend less time sitting. Even light physical activity can be beneficial. Watch cholesterol and blood  lipids Have your blood tested for lipids and cholesterol at 68 years of age, then have this test every 5 years. Have your cholesterol levels checked more often if:  Your lipid or cholesterol levels are high.  You are older than 67 years of age.  You are at high risk for heart disease. What should I know about cancer screening? Depending on your health history and family history, you may need to have cancer screening at various ages. This may include screening for:  Breast cancer.  Cervical cancer.  Colorectal cancer.  Skin cancer.  Lung cancer. What should I know about heart disease, diabetes, and high blood pressure? Blood pressure and heart disease  High blood pressure causes heart disease and increases the risk of stroke. This is more likely to develop in people who have high blood pressure readings, are of African descent, or are overweight.  Have your blood pressure checked: ? Every 3-5 years if you are 23-84 years of age. ? Every year if you are 37 years old or older. Diabetes Have regular diabetes screenings. This checks your fasting blood sugar level. Have the screening done:  Once every three years after age 5 if you are at a normal weight and have a low risk for diabetes.  More often and at a younger age if you are overweight or have a high risk for diabetes. What should I know about preventing infection? Hepatitis B If you have a higher risk for hepatitis B, you should be screened for this virus. Talk with your health care provider to find out if you are at risk for hepatitis B infection. Hepatitis C Testing is recommended for:  Everyone born from [birth year] through 1964.  Anyone with known risk factors for hepatitis C. Sexually transmitted infections (STIs)  Get screened for STIs, including gonorrhea and chlamydia, if: ? You are sexually active and are younger than 68 years of age. ? You are older than 68 years of age and your health care provider tells you that  you are at risk for this type of infection. ? Your sexual activity has changed since you were last screened, and you are at increased risk for chlamydia or gonorrhea. Ask your health care provider if you are at risk.  Ask your health care provider about whether you are at high risk for HIV. Your health care provider may recommend a prescription medicine to help prevent HIV infection. If you choose to take medicine to prevent HIV, you should first get tested for HIV. You should then be tested every 3 months for as long as you are taking the medicine. Pregnancy  If you are about to stop having your period (premenopausal) and you may become pregnant, seek counseling before you get pregnant.  Take 400 to 800 micrograms (mcg) of folic acid every day if you become pregnant.  Ask for birth control (contraception) if you want to prevent pregnancy. Osteoporosis and menopause Osteoporosis is a disease in which the bones lose minerals and strength with aging. This can result in bone fractures. If you are 73 years old or older, or if you are at risk for osteoporosis and fractures, ask your health care provider if you should:  Be screened for bone loss.  Take a calcium or vitamin D supplement  to lower your risk of fractures.  Be given hormone replacement therapy (HRT) to treat symptoms of menopause. Follow these instructions at home: Lifestyle  Do not use any products that contain nicotine or tobacco, such as cigarettes, e-cigarettes, and chewing tobacco. If you need help quitting, ask your health care provider.  Do not use street drugs.  Do not share needles.  Ask your health care provider for help if you need support or information about quitting drugs. Alcohol use  Do not drink alcohol if: ? Your health care provider tells you not to drink. ? You are pregnant, may be pregnant, or are planning to become pregnant.  If you drink alcohol: ? Limit how much you use to 0-1 drink a day. ? Limit  intake if you are breastfeeding.  Be aware of how much alcohol is in your drink. In the [place] one drink equals one 12 oz bottle of beer (355 mL), one 5 oz glass of wine (148 mL), or one 1 oz glass of hard liquor (44 mL). General instructions  Schedule regular health, dental, and eye exams.  Stay current with your vaccines.  Tell your health care provider if: ? You often feel depressed. ? You have ever been abused or do not feel safe at home. Summary  Adopting a healthy lifestyle and getting preventive care are important in promoting health and wellness.  Follow your health care provider's instructions about healthy diet, exercising, and getting tested or screened for diseases.  Follow your health care provider's instructions on monitoring your cholesterol and blood pressure. This information is not intended to replace advice given to you by your health care provider. Make sure you discuss any questions you have with your health care provider. Document Revised: 09/29/2018 Document Reviewed: 09/29/2018 Elsevier Patient Education  2020 [place] Shonte Beutler M.D.

## 2020-09-04 ENCOUNTER — Encounter: Payer: Self-pay | Admitting: Internal Medicine

## 2020-09-04 ENCOUNTER — Other Ambulatory Visit: Payer: Self-pay

## 2020-09-04 ENCOUNTER — Ambulatory Visit (INDEPENDENT_AMBULATORY_CARE_PROVIDER_SITE_OTHER): Payer: Medicare Other | Admitting: Internal Medicine

## 2020-09-04 VITALS — BP 140/74 | HR 78 | Temp 98.1°F | Ht 65.0 in | Wt 126.6 lb

## 2020-09-04 DIAGNOSIS — M8580 Other specified disorders of bone density and structure, unspecified site: Secondary | ICD-10-CM

## 2020-09-04 DIAGNOSIS — E7800 Pure hypercholesterolemia, unspecified: Secondary | ICD-10-CM | POA: Diagnosis not present

## 2020-09-04 DIAGNOSIS — M25552 Pain in left hip: Secondary | ICD-10-CM | POA: Diagnosis not present

## 2020-09-04 DIAGNOSIS — Z79899 Other long term (current) drug therapy: Secondary | ICD-10-CM | POA: Diagnosis not present

## 2020-09-04 NOTE — Patient Instructions (Signed)
See ortho team first about hip. Will notify you  of labs when available.   Continue lifestyle intervention healthy eating and exercise .    Health Maintenance, Female Adopting a healthy lifestyle and getting preventive care are important in promoting health and wellness. Ask your health care provider about:  The right schedule for you to have regular tests and exams.  Things you can do on your own to prevent diseases and keep yourself healthy. What should I know about diet, weight, and exercise? Eat a healthy diet   Eat a diet that includes plenty of vegetables, fruits, low-fat dairy products, and lean protein.  Do not eat a lot of foods that are high in solid fats, added sugars, or sodium. Maintain a healthy weight Body mass index (BMI) is used to identify weight problems. It estimates body fat based on height and weight. Your health care provider can help determine your BMI and help you achieve or maintain a healthy weight. Get regular exercise Get regular exercise. This is one of the most important things you can do for your health. Most adults should:  Exercise for at least 150 minutes each week. The exercise should increase your heart rate and make you sweat (moderate-intensity exercise).  Do strengthening exercises at least twice a week. This is in addition to the moderate-intensity exercise.  Spend less time sitting. Even light physical activity can be beneficial. Watch cholesterol and blood lipids Have your blood tested for lipids and cholesterol at 68 years of age, then have this test every 5 years. Have your cholesterol levels checked more often if:  Your lipid or cholesterol levels are high.  You are older than 68 years of age.  You are at high risk for heart disease. What should I know about cancer screening? Depending on your health history and family history, you may need to have cancer screening at various ages. This may include screening for:  Breast  cancer.  Cervical cancer.  Colorectal cancer.  Skin cancer.  Lung cancer. What should I know about heart disease, diabetes, and high blood pressure? Blood pressure and heart disease  High blood pressure causes heart disease and increases the risk of stroke. This is more likely to develop in people who have high blood pressure readings, are of African descent, or are overweight.  Have your blood pressure checked: ? Every 3-5 years if you are 53-58 years of age. ? Every year if you are 35 years old or older. Diabetes Have regular diabetes screenings. This checks your fasting blood sugar level. Have the screening done:  Once every three years after age 49 if you are at a normal weight and have a low risk for diabetes.  More often and at a younger age if you are overweight or have a high risk for diabetes. What should I know about preventing infection? Hepatitis B If you have a higher risk for hepatitis B, you should be screened for this virus. Talk with your health care provider to find out if you are at risk for hepatitis B infection. Hepatitis C Testing is recommended for:  Everyone born from [birth year] through 1964.  Anyone with known risk factors for hepatitis C. Sexually transmitted infections (STIs)  Get screened for STIs, including gonorrhea and chlamydia, if: ? You are sexually active and are younger than 68 years of age. ? You are older than 68 years of age and your health care provider tells you that you are at risk for this type of infection. ?  Your sexual activity has changed since you were last screened, and you are at increased risk for chlamydia or gonorrhea. Ask your health care provider if you are at risk.  Ask your health care provider about whether you are at high risk for HIV. Your health care provider may recommend a prescription medicine to help prevent HIV infection. If you choose to take medicine to prevent HIV, you should first get tested for HIV. You should  then be tested every 3 months for as long as you are taking the medicine. Pregnancy  If you are about to stop having your period (premenopausal) and you may become pregnant, seek counseling before you get pregnant.  Take 400 to 800 micrograms (mcg) of folic acid every day if you become pregnant.  Ask for birth control (contraception) if you want to prevent pregnancy. Osteoporosis and menopause Osteoporosis is a disease in which the bones lose minerals and strength with aging. This can result in bone fractures. If you are 96 years old or older, or if you are at risk for osteoporosis and fractures, ask your health care provider if you should:  Be screened for bone loss.  Take a calcium or vitamin D supplement to lower your risk of fractures.  Be given hormone replacement therapy (HRT) to treat symptoms of menopause. Follow these instructions at home: Lifestyle  Do not use any products that contain nicotine or tobacco, such as cigarettes, e-cigarettes, and chewing tobacco. If you need help quitting, ask your health care provider.  Do not use street drugs.  Do not share needles.  Ask your health care provider for help if you need support or information about quitting drugs. Alcohol use  Do not drink alcohol if: ? Your health care provider tells you not to drink. ? You are pregnant, may be pregnant, or are planning to become pregnant.  If you drink alcohol: ? Limit how much you use to 0-1 drink a day. ? Limit intake if you are breastfeeding.  Be aware of how much alcohol is in your drink. In the [place] one drink equals one 12 oz bottle of beer (355 mL), one 5 oz glass of wine (148 mL), or one 1 oz glass of hard liquor (44 mL). General instructions  Schedule regular health, dental, and eye exams.  Stay current with your vaccines.  Tell your health care provider if: ? You often feel depressed. ? You have ever been abused or do not feel safe at home. Summary  Adopting a  healthy lifestyle and getting preventive care are important in promoting health and wellness.  Follow your health care provider's instructions about healthy diet, exercising, and getting tested or screened for diseases.  Follow your health care provider's instructions on monitoring your cholesterol and blood pressure. This information is not intended to replace advice given to you by your health care provider. Make sure you discuss any questions you have with your health care provider. Document Revised: 09/29/2018 Document Reviewed: 09/29/2018 Elsevier Patient Education  2020 [employer].

## 2020-09-05 LAB — HEPATIC FUNCTION PANEL
AG Ratio: 1.8 (calc) (ref 1.0–2.5)
ALT: 13 U/L (ref 6–29)
AST: 21 U/L (ref 10–35)
Albumin: 4.6 g/dL (ref 3.6–5.1)
Alkaline phosphatase (APISO): 66 U/L (ref 37–153)
Bilirubin, Direct: 0.1 mg/dL (ref 0.0–0.2)
Globulin: 2.5 g/dL (calc) (ref 1.9–3.7)
Indirect Bilirubin: 0.5 mg/dL (calc) (ref 0.2–1.2)
Total Bilirubin: 0.6 mg/dL (ref 0.2–1.2)
Total Protein: 7.1 g/dL (ref 6.1–8.1)

## 2020-09-05 LAB — BASIC METABOLIC PANEL WITH GFR
BUN: 18 mg/dL (ref 7–25)
CO2: 29 mmol/L (ref 20–32)
Calcium: 9.7 mg/dL (ref 8.6–10.4)
Chloride: 103 mmol/L (ref 98–110)
Creat: 0.76 mg/dL (ref 0.50–0.99)
GFR, Est African American: 94 mL/min/{1.73_m2} (ref >=60)
GFR, Est Non African American: 81 mL/min/{1.73_m2} (ref >=60)
Glucose, Bld: 95 mg/dL (ref 65–99)
Potassium: 4.4 mmol/L (ref 3.5–5.3)
Sodium: 140 mmol/L (ref 135–146)

## 2020-09-05 LAB — LIPID PANEL
Cholesterol: 194 mg/dL (ref <200)
HDL: 78 mg/dL (ref >=50)
LDL Cholesterol (Calc): 99 mg/dL (calc)
Non-HDL Cholesterol (Calc): 116 mg/dL (calc) (ref <130)
Total CHOL/HDL Ratio: 2.5 (calc) (ref <5.0)
Triglycerides: 83 mg/dL (ref <150)

## 2020-09-05 LAB — CBC WITH DIFFERENTIAL/PLATELET
Absolute Monocytes: 372 cells/uL (ref 200–950)
Basophils Absolute: 51 cells/uL (ref 0–200)
Basophils Relative: 1 %
Eosinophils Absolute: 112 cells/uL (ref 15–500)
Eosinophils Relative: 2.2 %
HCT: 41.3 % (ref 35.0–45.0)
Hemoglobin: 14 g/dL (ref 11.7–15.5)
Lymphs Abs: 1464 cells/uL (ref 850–3900)
MCH: 31.2 pg (ref 27.0–33.0)
MCHC: 33.9 g/dL (ref 32.0–36.0)
MCV: 92 fL (ref 80.0–100.0)
MPV: 11.1 fL (ref 7.5–12.5)
Monocytes Relative: 7.3 %
Neutro Abs: 3101 cells/uL (ref 1500–7800)
Neutrophils Relative %: 60.8 %
Platelets: 224 10*3/uL (ref 140–400)
RBC: 4.49 10*6/uL (ref 3.80–5.10)
RDW: 11.5 % (ref 11.0–15.0)
Total Lymphocyte: 28.7 %
WBC: 5.1 10*3/uL (ref 3.8–10.8)

## 2020-09-05 LAB — C-REACTIVE PROTEIN: CRP: 0.8 mg/L (ref <8.0)

## 2020-09-05 NOTE — Progress Notes (Signed)
Results are  normal favorable and negative  inflammatory marker  crp  No change   in advice Aleene Davidson

## 2020-11-09 ENCOUNTER — Telehealth: Payer: Self-pay | Admitting: Internal Medicine

## 2020-11-09 NOTE — Telephone Encounter (Signed)
Pt is calling in stating that she is calling to check the status of the form (Preanesthesia) from [facility] (Dr. Maureen Ralphs) pt is due to have surgery on 11/27/2020.  Pt would like to have a call back.

## 2020-11-12 NOTE — Telephone Encounter (Signed)
Pt is calling in stating that she is calling to check the status of the form (Preanesthesia) from [facility] (Dr. Maureen Ralphs) pt is due to have surgery on 11/27/2020.  Harbor Paster,cma

## 2020-11-12 NOTE — Telephone Encounter (Signed)
I found the form and signed it    And will  Get it faxed over  [place] for delay  Our usual form tracking didn't happen and other issues caused delay .

## 2020-11-12 NOTE — Telephone Encounter (Signed)
Form was given to me and it was faxed, confirmation given.  Accalia Rigdon,cma

## 2020-11-20 ENCOUNTER — Other Ambulatory Visit: Payer: Self-pay | Admitting: Obstetrics and Gynecology

## 2020-11-20 ENCOUNTER — Telehealth: Payer: Self-pay | Admitting: Internal Medicine

## 2020-11-20 DIAGNOSIS — Z1231 Encounter for screening mammogram for malignant neoplasm of breast: Secondary | ICD-10-CM

## 2020-11-20 NOTE — Telephone Encounter (Signed)
Left message for patient to call back and schedule Medicare Annual Wellness Visit (AWV) either virtually or in office.   Last AWV no information please schedule at anytime with [facility] Nurse Health Advisor 1 or 2   This should be a 45 minute visit. 

## 2020-12-20 ENCOUNTER — Ambulatory Visit
Admission: RE | Admit: 2020-12-20 | Discharge: 2020-12-20 | Disposition: A | Discharge status: Home / Self Care | Payer: Medicare Other | Source: Ambulatory Visit

## 2020-12-20 ENCOUNTER — Other Ambulatory Visit: Payer: Self-pay

## 2020-12-20 DIAGNOSIS — Z1231 Encounter for screening mammogram for malignant neoplasm of breast: Secondary | ICD-10-CM

## 2021-01-14 ENCOUNTER — Telehealth: Payer: Self-pay | Admitting: Internal Medicine

## 2021-01-14 NOTE — Telephone Encounter (Signed)
Patient declined  

## 2021-01-14 NOTE — Telephone Encounter (Signed)
Left message for patient to call back and schedule Medicare Annual Wellness Visit (AWV) either virtually or in office. No detailed message   AWV-I PER [place] AS OF 09/19/18  please schedule at anytime with [facility] Nurse Health Advisor 1 or 2   This should be a 45 minute visit.  Please do not schedule Thursday afternoon or fridays

## 2021-02-21 ENCOUNTER — Other Ambulatory Visit: Payer: Self-pay

## 2021-02-21 ENCOUNTER — Other Ambulatory Visit: Payer: Self-pay | Admitting: Orthopedic Surgery

## 2021-02-21 ENCOUNTER — Encounter (HOSPITAL_BASED_OUTPATIENT_CLINIC_OR_DEPARTMENT_OTHER): Payer: Self-pay | Admitting: Orthopedic Surgery

## 2021-02-25 ENCOUNTER — Other Ambulatory Visit (HOSPITAL_COMMUNITY): Payer: Medicare Other

## 2021-02-26 ENCOUNTER — Other Ambulatory Visit (HOSPITAL_COMMUNITY)
Admission: RE | Admit: 2021-02-26 | Discharge: 2021-02-26 | Disposition: A | Discharge status: Home / Self Care | Payer: Medicare Other | Source: Ambulatory Visit | Attending: Orthopedic Surgery | Admitting: Orthopedic Surgery

## 2021-02-26 DIAGNOSIS — Z01812 Encounter for preprocedural laboratory examination: Secondary | ICD-10-CM | POA: Insufficient documentation

## 2021-02-26 DIAGNOSIS — Z20822 Contact with and (suspected) exposure to covid-19: Secondary | ICD-10-CM | POA: Diagnosis not present

## 2021-02-26 LAB — SARS CORONAVIRUS 2 (TAT 6-24 HRS): SARS Coronavirus 2: NEGATIVE

## 2021-02-28 ENCOUNTER — Encounter (HOSPITAL_BASED_OUTPATIENT_CLINIC_OR_DEPARTMENT_OTHER): Payer: Self-pay | Admitting: Orthopedic Surgery

## 2021-02-28 ENCOUNTER — Encounter (HOSPITAL_BASED_OUTPATIENT_CLINIC_OR_DEPARTMENT_OTHER)
Admission: RE | Disposition: A | Discharge status: Home / Self Care | Payer: Self-pay | Source: Ambulatory Visit | Attending: Orthopedic Surgery

## 2021-02-28 ENCOUNTER — Ambulatory Visit (HOSPITAL_BASED_OUTPATIENT_CLINIC_OR_DEPARTMENT_OTHER): Payer: Medicare Other | Admitting: Anesthesiology

## 2021-02-28 ENCOUNTER — Other Ambulatory Visit: Payer: Self-pay

## 2021-02-28 ENCOUNTER — Ambulatory Visit (HOSPITAL_BASED_OUTPATIENT_CLINIC_OR_DEPARTMENT_OTHER)
Admission: RE | Admit: 2021-02-28 | Discharge: 2021-02-28 | Disposition: A | Discharge status: Home / Self Care | Payer: Medicare Other | Source: Ambulatory Visit | Attending: Orthopedic Surgery | Admitting: Orthopedic Surgery

## 2021-02-28 DIAGNOSIS — L729 Follicular cyst of the skin and subcutaneous tissue, unspecified: Secondary | ICD-10-CM | POA: Insufficient documentation

## 2021-02-28 DIAGNOSIS — M152 Bouchard's nodes (with arthropathy): Secondary | ICD-10-CM | POA: Insufficient documentation

## 2021-02-28 DIAGNOSIS — M25742 Osteophyte, left hand: Secondary | ICD-10-CM | POA: Insufficient documentation

## 2021-02-28 DIAGNOSIS — M67442 Ganglion, left hand: Secondary | ICD-10-CM | POA: Diagnosis present

## 2021-02-28 HISTORY — PX: CYST EXCISION: SHX5701

## 2021-02-28 SURGERY — CYST REMOVAL
Anesthesia: Regional | Site: Finger | Laterality: Left

## 2021-02-28 MED ORDER — HYDROMORPHONE HCL 1 MG/ML IJ SOLN: 0.2500 mg | INTRAMUSCULAR | Status: DC | PRN

## 2021-02-28 MED ORDER — CEFAZOLIN SODIUM-DEXTROSE 2-4 GM/100ML-% IV SOLN
INTRAVENOUS | Status: AC
  Filled 2021-02-28: qty 100

## 2021-02-28 MED ORDER — ONDANSETRON HCL 4 MG/2ML IJ SOLN
INTRAMUSCULAR | Status: DC | PRN
  Administered 2021-02-28: 4 mg via INTRAVENOUS

## 2021-02-28 MED ORDER — CEFAZOLIN SODIUM-DEXTROSE 2-4 GM/100ML-% IV SOLN
2.0000 g | INTRAVENOUS | Status: AC
  Administered 2021-02-28: 2 g via INTRAVENOUS

## 2021-02-28 MED ORDER — TRAMADOL HCL 50 MG PO TABS: 50.0000 mg | ORAL_TABLET | Freq: Four times a day (QID) | ORAL | 0 refills | Status: DC | PRN

## 2021-02-28 MED ORDER — OXYCODONE HCL 5 MG PO TABS: 5.0000 mg | ORAL_TABLET | Freq: Once | ORAL | Status: DC | PRN

## 2021-02-28 MED ORDER — MIDAZOLAM HCL 2 MG/2ML IJ SOLN
INTRAMUSCULAR | Status: AC
  Filled 2021-02-28: qty 2

## 2021-02-28 MED ORDER — MIDAZOLAM HCL 5 MG/5ML IJ SOLN
INTRAMUSCULAR | Status: DC | PRN
  Administered 2021-02-28: 2 mg via INTRAVENOUS

## 2021-02-28 MED ORDER — OXYCODONE HCL 5 MG/5ML PO SOLN: 5.0000 mg | Freq: Once | ORAL | Status: DC | PRN

## 2021-02-28 MED ORDER — FENTANYL CITRATE (PF) 100 MCG/2ML IJ SOLN
INTRAMUSCULAR | Status: DC | PRN
  Administered 2021-02-28: 100 ug via INTRAVENOUS

## 2021-02-28 MED ORDER — PROPOFOL 10 MG/ML IV BOLUS
INTRAVENOUS | Status: DC | PRN
  Administered 2021-02-28: 20 mg via INTRAVENOUS

## 2021-02-28 MED ORDER — PROPOFOL 500 MG/50ML IV EMUL
INTRAVENOUS | Status: DC | PRN
  Administered 2021-02-28: 50 ug/kg/min via INTRAVENOUS

## 2021-02-28 MED ORDER — BUPIVACAINE HCL (PF) 0.25 % IJ SOLN
INTRAMUSCULAR | Status: DC | PRN
  Administered 2021-02-28: 7 mL

## 2021-02-28 MED ORDER — LACTATED RINGERS IV SOLN: INTRAVENOUS | Status: DC

## 2021-02-28 MED ORDER — FENTANYL CITRATE (PF) 100 MCG/2ML IJ SOLN
INTRAMUSCULAR | Status: AC
  Filled 2021-02-28: qty 2

## 2021-02-28 MED ORDER — PROMETHAZINE HCL 25 MG/ML IJ SOLN: 6.2500 mg | INTRAMUSCULAR | Status: DC | PRN

## 2021-02-28 MED ORDER — LIDOCAINE HCL (PF) 0.5 % IJ SOLN
INTRAMUSCULAR | Status: DC | PRN
  Administered 2021-02-28: 30 mL via INTRAVENOUS

## 2021-02-28 SURGICAL SUPPLY — 59 items
APL PRP STRL LF DISP 70% ISPRP (MISCELLANEOUS) ×2
BAG DECANTER FOR FLEXI CONT (MISCELLANEOUS) IMPLANT
BLADE MINI RND TIP GREEN BEAV (BLADE) IMPLANT
BLADE SURG 15 STRL LF DISP TIS (BLADE) ×2 IMPLANT
BLADE SURG 15 STRL SS (BLADE) ×3
BNDG CMPR 9X4 STRL LF SNTH (GAUZE/BANDAGES/DRESSINGS) ×2
BNDG COHESIVE 1X5 TAN STRL LF (GAUZE/BANDAGES/DRESSINGS) ×2 IMPLANT
BNDG COHESIVE 2X5 TAN STRL LF (GAUZE/BANDAGES/DRESSINGS) IMPLANT
BNDG COHESIVE 3X5 TAN STRL LF (GAUZE/BANDAGES/DRESSINGS) IMPLANT
BNDG ESMARK 4X9 LF (GAUZE/BANDAGES/DRESSINGS) ×2 IMPLANT
BNDG GAUZE ELAST 4 BULKY (GAUZE/BANDAGES/DRESSINGS) IMPLANT
CHLORAPREP W/TINT 26 (MISCELLANEOUS) ×3 IMPLANT
CORD BIPOLAR FORCEPS 12FT (ELECTRODE) ×3 IMPLANT
COVER BACK TABLE 60X90IN (DRAPES) ×3 IMPLANT
COVER MAYO STAND STRL (DRAPES) ×3 IMPLANT
COVER WAND RF STERILE (DRAPES) IMPLANT
CUFF TOURN SGL QUICK 18X4 (TOURNIQUET CUFF) ×2 IMPLANT
DECANTER SPIKE VIAL GLASS SM (MISCELLANEOUS) IMPLANT
DRAIN PENROSE 1/2X12 LTX STRL (WOUND CARE) IMPLANT
DRAIN PENROSE 12X.25 LTX STRL (MISCELLANEOUS) IMPLANT
DRAPE EXTREMITY T 121X128X90 (DISPOSABLE) ×3 IMPLANT
DRAPE SURG 17X23 STRL (DRAPES) ×3 IMPLANT
GAUZE PACKING IODOFORM 1/4X15 (PACKING) IMPLANT
GAUZE SPONGE 4X4 12PLY STRL (GAUZE/BANDAGES/DRESSINGS) ×3 IMPLANT
GAUZE XEROFORM 1X8 LF (GAUZE/BANDAGES/DRESSINGS) ×3 IMPLANT
GLOVE SRG 8 PF TXTR STRL LF DI (GLOVE) ×2 IMPLANT
GLOVE SURG ORTHO LTX SZ8 (GLOVE) ×3 IMPLANT
GLOVE SURG UNDER POLY LF SZ8 (GLOVE) ×3
GLOVE SURG UNDER POLY LF SZ8.5 (GLOVE) ×3 IMPLANT
GOWN STRL REUS W/ TWL LRG LVL3 (GOWN DISPOSABLE) ×2 IMPLANT
GOWN STRL REUS W/TWL LRG LVL3 (GOWN DISPOSABLE) ×3
GOWN STRL REUS W/TWL XL LVL3 (GOWN DISPOSABLE) ×3 IMPLANT
LOOP VESSEL MAXI BLUE (MISCELLANEOUS) IMPLANT
NDL PRECISIONGLIDE 27X1.5 (NEEDLE) IMPLANT
NEEDLE PRECISIONGLIDE 27X1.5 (NEEDLE) ×3 IMPLANT
NS IRRIG 1000ML POUR BTL (IV SOLUTION) ×3 IMPLANT
PACK BASIN DAY SURGERY FS (CUSTOM PROCEDURE TRAY) ×3 IMPLANT
PAD CAST 3X4 CTTN HI CHSV (CAST SUPPLIES) IMPLANT
PADDING CAST ABS 3INX4YD NS (CAST SUPPLIES)
PADDING CAST ABS 4INX4YD NS (CAST SUPPLIES) ×1
PADDING CAST ABS COTTON 3X4 (CAST SUPPLIES) IMPLANT
PADDING CAST ABS COTTON 4X4 ST (CAST SUPPLIES) ×2 IMPLANT
PADDING CAST COTTON 3X4 STRL (CAST SUPPLIES)
SPLINT FINGER 3.25 911903 (SOFTGOODS) ×2 IMPLANT
SPLINT PLASTER CAST XFAST 3X15 (CAST SUPPLIES) IMPLANT
SPLINT PLASTER XTRA FASTSET 3X (CAST SUPPLIES)
STOCKINETTE 4X48 STRL (DRAPES) ×3 IMPLANT
SUT CHROMIC 6 0 PS 4 (SUTURE) ×2 IMPLANT
SUT ETHILON 4 0 PS 2 18 (SUTURE) ×3 IMPLANT
SUT MERSILENE 5 0 P 3 (SUTURE) ×2 IMPLANT
SUT VIC AB 4-0 P2 18 (SUTURE) IMPLANT
SWAB COLLECTION DEVICE MRSA (MISCELLANEOUS) IMPLANT
SWAB CULTURE ESWAB REG 1ML (MISCELLANEOUS) IMPLANT
SYR BULB EAR ULCER 3OZ GRN STR (SYRINGE) ×3 IMPLANT
SYR CONTROL 10ML LL (SYRINGE) ×2 IMPLANT
SYR TOOMEY 50ML (SYRINGE) IMPLANT
TOWEL GREEN STERILE FF (TOWEL DISPOSABLE) ×4 IMPLANT
TUBE FEEDING ENTERAL 5FR 16IN (TUBING) IMPLANT
UNDERPAD 30X36 HEAVY ABSORB (UNDERPADS AND DIAPERS) ×3 IMPLANT

## 2021-02-28 NOTE — Anesthesia Procedure Notes (Signed)
Anesthesia Regional Block: Bier block (IV Regional)   Pre-Anesthetic Checklist: ,, timeout performed, Correct Patient, Correct Site, Correct Laterality, Correct Procedure,, site marked, surgical consent,, at surgeon's request  Laterality: Left     Needles:  Injection technique: Single-shot  Needle Type: Other      Needle Gauge: 22     Additional Needles:   Procedures:,,,,, intact distal pulses, Esmarch exsanguination, single tourniquet utilized,  Narrative:  Start time: 02/28/2021 12:41 PM End time: 02/28/2021 12:42 PM  Performed by: Personally

## 2021-02-28 NOTE — Transfer of Care (Signed)
Immediate Anesthesia Transfer of Care Note  Patient: Patricia Potts  Procedure(s) Performed: EXCISION CYST LEFT MIDDLE FINGER, DEBRIDEMENT DISTAL INTERPHALANGEAL JOINT (Left Finger)  Patient Location: PACU  Anesthesia Type:MAC  Level of Consciousness: drowsy  Airway & Oxygen Therapy: Patient Spontanous Breathing and Patient connected to face mask oxygen  Post-op Assessment: Report given to RN and Post -op Vital signs reviewed and stable  Post vital signs: Reviewed and stable  Last Vitals:  Vitals Value Taken Time  BP 118/67 02/28/21 1323  Temp    Pulse 61 02/28/21 1324  Resp 9 02/28/21 1324  SpO2 100 % 02/28/21 1324  Vitals shown include unvalidated device data.  Last Pain:  Vitals:   02/28/21 1033  TempSrc: Oral  PainSc: 1          Complications: No complications documented.

## 2021-02-28 NOTE — Anesthesia Preprocedure Evaluation (Signed)
Anesthesia Evaluation  Patient identified by MRN, date of birth, ID band Patient awake    Reviewed: Allergy & Precautions, NPO status , Patient's Chart, lab work & pertinent test results  Airway Mallampati: II  TM Distance: >3 FB Neck ROM: Full    Dental no notable dental hx.    Pulmonary neg pulmonary ROS,    Pulmonary exam normal breath sounds clear to auscultation       Cardiovascular negative cardio ROS Normal cardiovascular exam Rhythm:Regular Rate:Normal     Neuro/Psych  Headaches, negative psych ROS   GI/Hepatic negative GI ROS, Neg liver ROS,   Endo/Other  negative endocrine ROS  Renal/GU negative Renal ROS  negative genitourinary   Musculoskeletal negative musculoskeletal ROS (+)   Abdominal   Peds negative pediatric ROS (+)  Hematology negative hematology ROS (+)   Anesthesia Other Findings   Reproductive/Obstetrics negative OB ROS                             Anesthesia Physical Anesthesia Plan  ASA: II  Anesthesia Plan: Bier Block and Bier Block-LIDOCAINE ONLY   Post-op Pain Management:    Induction: Intravenous  PONV Risk Score and Plan: 2 and Ondansetron, Midazolam and Treatment may vary due to age or medical condition  Airway Management Planned: Simple Face Mask  Additional Equipment:   Intra-op Plan:   Post-operative Plan:   Informed Consent: I have reviewed the patients History and Physical, chart, labs and discussed the procedure including the risks, benefits and alternatives for the proposed anesthesia with the patient or authorized representative who has indicated his/her understanding and acceptance.     Dental advisory given  Plan Discussed with: CRNA  Anesthesia Plan Comments:         Anesthesia Quick Evaluation

## 2021-02-28 NOTE — Anesthesia Postprocedure Evaluation (Signed)
Anesthesia Post Note  Patient: Patricia Potts  Procedure(s) Performed: EXCISION CYST LEFT MIDDLE FINGER, DEBRIDEMENT DISTAL INTERPHALANGEAL JOINT (Left Finger)     Patient location during evaluation: PACU Anesthesia Type: Bier Block Level of consciousness: awake and alert Pain management: pain level controlled Vital Signs Assessment: post-procedure vital signs reviewed and stable Respiratory status: spontaneous breathing, nonlabored ventilation, respiratory function stable and patient connected to nasal cannula oxygen Cardiovascular status: stable and blood pressure returned to baseline Postop Assessment: no apparent nausea or vomiting Anesthetic complications: no   No complications documented.  Last Vitals:  Vitals:   02/28/21 1323 02/28/21 1330  BP: 118/67 109/82  Pulse:  64  Resp: 17 18  Temp: (!) 36.1 C   SpO2: 99% 100%    Last Pain:  Vitals:   02/28/21 1323  TempSrc:   PainSc: 0-No pain                 Barnet Glasgow

## 2021-02-28 NOTE — Brief Op Note (Signed)
02/28/2021  1:24 PM  PATIENT:  Patricia Potts  70 y.o. 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 CYST  PROCEDURE:  Procedure(s) with comments: EXCISION CYST LEFT MIDDLE FINGER, DEBRIDEMENT DISTAL INTERPHALANGEAL JOINT (Left) - 60 MINUTES  SURGEON:  Surgeon(s) and Role:    * Daryll Brod, MD - Primary  PHYSICIAN ASSISTANT:   ASSISTANTS: none   ANESTHESIA:   local, regional and IV sedation  EBL:  1 mL   BLOOD ADMINISTERED:none  DRAINS: none   LOCAL MEDICATIONS USED:  BUPIVICAINE   SPECIMEN:  Excision  DISPOSITION OF SPECIMEN:  PATHOLOGY  COUNTS:  YES  TOURNIQUET:   Total Tourniquet Time Documented: Forearm (Left) - 40 minutes Total: Forearm (Left) - 40 minutes   DICTATION: .Dragon Dictation  PLAN OF CARE: Discharge to home after PACU  PATIENT DISPOSITION:  PACU - hemodynamically stable.

## 2021-02-28 NOTE — Discharge Instructions (Addendum)

## 2021-02-28 NOTE — H&P (Signed)
Patricia Potts is an 70 y.o. female.   Chief Complaint: mass left middle finger HPI: Patricia Potts is a 70 yo female with a left middle finger mass. This has been present for 1 month.. She has no history of injury to it. Is not causing her pain or discomfort he has tried taking medicine without any significant result. She has had mucoid cyst removed in the past. She has no history of diabetes thyroid problems or gout. Family history is positive for gout otherwise negative     Past Medical History:  Diagnosis Date  . Actinic keratosis    sees derm 2 x per wyear.  . Chickenpox   . Migraine    as teen young adult hormonal  . Osteopenia    Dexa Scan per gyne  . SCCA (squamous cell carcinoma) of skin   . Scoliosis   . Seasonal allergies   . Uterine polyp     Past Surgical History:  Procedure Laterality Date  . CESAREAN SECTION  [id number]  . Mucoid Cysts     Removed from both hands and fingers sypher   . polyp     uterine removal   . TOTAL HIP ARTHROPLASTY     left  . TUBAL LIGATION  1989    Family History  Problem Relation Age of Onset  . Cancer Mother        Squamous cell cancer  . Coronary artery disease Father        died in 67s  had  pvd   . Hyperlipidemia Father   . Hypertension Father   . Pulmonary embolism Brother        evaluatied at [facility] for hypercoag state   . Cancer Maternal Grandmother        Colon cancer  . Cancer Maternal Grandfather        Colon cancer  . Down syndrome Son   . Breast cancer Neg Hx    Social History:  reports that she has never smoked. She has never used smokeless tobacco. She reports current alcohol use. No history on file for drug use.  Allergies:  Allergies  Allergen Reactions  . Sulfonamide Derivatives     No medications prior to admission.    Results for orders placed or performed during the hospital encounter of 02/26/21 (from the past 48 hour(s))  SARS CORONAVIRUS 2 (TAT 6-24 HRS) Nasopharyngeal Nasopharyngeal Swab     Status:  None   Collection Time: 02/26/21  9:11 AM   Specimen: Nasopharyngeal Swab  Result Value Ref Range   SARS Coronavirus 2 NEGATIVE NEGATIVE    Comment: (NOTE) SARS-CoV-2 target nucleic acids are NOT DETECTED.  The SARS-CoV-2 RNA is generally detectable in upper and lower respiratory specimens during the acute phase of infection. Negative results do not preclude SARS-CoV-2 infection, do not rule out co-infections with other pathogens, and should not be used as the sole basis for treatment or other patient management decisions. Negative results must be combined with clinical observations, patient history, and epidemiological information. The expected result is Negative.  Fact Sheet for Patients: SugarRoll.be  Fact Sheet for Healthcare Providers: https://www.woods-mathews.com/  This test is not yet approved or cleared by the [place] FDA and  has been authorized for detection and/or diagnosis of SARS-CoV-2 by FDA under an Emergency Use Authorization (EUA). This EUA will remain  in effect (meaning this test can be used) for the duration of the COVID-19 declaration under Se ction 564(b)(1) of the Act, 21 U.S.C. section 360bbb-3(b)(1),  unless the authorization is terminated or revoked sooner.  Performed at [facility], [place] [address], [place], [place] [address]     No results found.   Pertinent items are noted in HPI.  Height 5\' 5"  (1.651 m), weight 58.1 kg, last menstrual period 08/07/2011.  General appearance: alert, cooperative and appears stated age Head: Normocephalic, without obvious abnormality Neck: no JVD Resp: clear to auscultation bilaterally Cardio: regular rate and rhythm, S1, S2 normal, no murmur, click, rub or gallop GI: soft, non-tender; bowel sounds normal; no masses,  no organomegaly Extremities: mass left middle finger Pulses: 2+ and symmetric Skin: Skin color, texture, turgor normal. No rashes or  lesions Neurologic: Grossly normal Incision/Wound: na  Assessment/Plan Assessment:   Mucoid cyst, joint  Osteoarthritis of finger of right hand    Plan: We have discussed etiology with her she would like to have it removed. She is scheduled for excision mucoid cyst debridement PIP joint left middle finger as an outpatient under regional anesthesia she is aware there is no guarantee to the surgery possibility of infection recurrence injury to arteries nerves tendons incomplete relief of symptoms dystrophy this will be scheduled as an outpatient under regional anesthesia.      Daryll Brod 02/28/2021, 5:12 AM

## 2021-02-28 NOTE — Op Note (Signed)
NAME: Patricia Potts MEDICAL RECORD NO: [id number] DATE OF BIRTH: Aug 12, 1951 FACILITY[facility] LOCATION: [facility] PHYSICIAN: Wynonia Sours, MD   OPERATIVE REPORT   DATE OF PROCEDURE: 02/28/21    PREOPERATIVE DIAGNOSIS:   Large ganglion cyst PIP joint Left middle finger with degenerative arthritis PIP joint   POSTOPERATIVE DIAGNOSIS:   Same   PROCEDURE:   Excision ganglion cyst with debridement PIP joint osteophytes and synovectomy    SURGEON: Daryll Brod, M.D.   ASSISTANT: none   ANESTHESIA:  Bier block with sedation and Local   INTRAVENOUS FLUIDS:  Per anesthesia flow sheet.   ESTIMATED BLOOD LOSS:  Minimal.   COMPLICATIONS:  None.   SPECIMENS:   Cyst osteophytes synovial to   TOURNIQUET TIME:    Total Tourniquet Time Documented: Forearm (Left) - 40 minutes Total: Forearm (Left) - 40 minutes    DISPOSITION:  Stable to PACU.   INDICATIONS: Patient is a 70 year old female with a history of a large mass of the PIP joint of her left middle finger..  She has elected undergo surgical excision with debridement of the PIP joint.  Pre-.  Postoperative course been discussed along with risks and complications.  She is aware there is no guarantee to the surgery the possibility of infection recurrence of the cyst stiffness injury to arteries nerves tendons incomplete relief of symptoms dystrophy.  In the preoperative area the patient is seen the extremity marked by both patient and surgeon antibiotic given  OPERATIVE COURSE: Patient is brought to the operating room placed in the supine position with the left arm free.  A forearm IV regional anesthetic was carried out without difficulty under the direction of the anesthesia department.  A prep was done with ChloraPrep and a 3-minute dry time allowed.  A detailed timeout was taken confirming patient and procedure.  A metacarpal block was given quarter percent bupivacaine without epinephrine 8 cc was used.  A curvilinear  incision was made directly over the mass on the radial side of the finger.  This carried down through subcutaneous tissue.  Bleeders were electrocauterized with bipolar a multilobulated large cyst measuring Approximately a centimeter and a half in length was easily identified the extensor tendon was extremely stretched over this separated between the central slip and lateral band with a lateral band being pushed volarly.  There was a lattice type injury to the extensor tendon over the central part of the cyst.  The cyst was then dissected free following it down to the joint.  This left a large defect to where the lateral band had been pushed volarly in the central slip slightly ulnarly.  The joint capsule was then opened and that this was a neck structure immediately present.  Large osteophyte was present on the dorsal aspect of the proximal phalanx.  A partial synovectomy was performed leaving the capsule intact much as possible on the cyst side to allow closure.  The osteophyte was removed with a hemostatic rondure and house curette.  All was sent to pathology.  The wound was copious irrigated with saline.  The capsule was then closed with figure-of-eight 6-0 chromic sutures.  The lateral band of the extensor tendon was then mobilized by incising the retinacular ligaments allowing this to be brought neck and dorsal on the proximal phalanx middle phalanx and allowed closure of the opening to cover over the otherwise entirely uncovered portion of the capsule.  Full passive flexion extension of the finger was present following the repair  which was done with 5-0 Mersilene sutures.  The wound was then accurate irrigated with saline.  The wound was closed with interrupted 4-0 nylon sutures.  A sterile compressive dressing and splint to the PIP joint was applied.  Deflation of the tourniquet remaining fingers pink.  She was taken to the recovery room for observation in satisfactory condition.  She will be discharged home  to return to the [facility] in 1 week on Tylenol ibuprofen for pain with tramadol for breakthrough.   Daryll Brod, MD Electronically signed, 02/28/21

## 2021-03-01 ENCOUNTER — Encounter (HOSPITAL_BASED_OUTPATIENT_CLINIC_OR_DEPARTMENT_OTHER): Payer: Self-pay | Admitting: Orthopedic Surgery

## 2021-03-01 LAB — SURGICAL PATHOLOGY

## 2021-03-28 ENCOUNTER — Telehealth: Payer: Self-pay | Admitting: Internal Medicine

## 2021-03-28 NOTE — Telephone Encounter (Signed)
Left message for patient to call back and schedule Medicare Annual Wellness Visit (AWV) either virtually or in office.    AWV-I PER PALMETTO AS OF 09/19/18 please schedule at anytime with [facility] Nurse Health Advisor 1 or 2   This should be a 45 minute visit.

## 2021-03-28 NOTE — Telephone Encounter (Signed)
Pt is calling in stating that she do not want to do this type of visit at this time.

## 2021-04-11 MED ORDER — CLOBETASOL PROPIONATE 0.05 % EX CREA: 1.0000 | TOPICAL_CREAM | Freq: Two times a day (BID) | CUTANEOUS | 0 refills | Status: DC

## 2021-04-11 NOTE — Telephone Encounter (Signed)
I spoke with the pt and she reported no shortness of breath or difficulty breathing. Pt stated that she was stung on Tuesday and was having an allergic reaction. Pt informed me that she is experiencing itching and would like for Clobetisol sent in as it was recommended by a PA.

## 2021-04-11 NOTE — Telephone Encounter (Signed)
I sent in 15 mg of clobetasol cream to [facility] it helps .

## 2021-04-17 NOTE — Telephone Encounter (Signed)
Noted  

## 2021-05-06 ENCOUNTER — Telehealth: Payer: Self-pay | Admitting: Internal Medicine

## 2021-05-06 NOTE — Telephone Encounter (Signed)
Left message for patient to call back and schedule Medicare Annual Wellness Visit (AWV) either virtually or in office.   AWV-I PER PALMETTO AS OF 09/19/18 please schedule at anytime with [facility] Nurse Health Advisor 1 or 2   This should be a 45 minute visit.

## 2021-05-06 NOTE — Telephone Encounter (Signed)
Pt has declined this type of visit and would like to be removed from the calling list

## 2021-05-07 NOTE — Telephone Encounter (Signed)
FYI

## 2021-09-18 ENCOUNTER — Encounter: Payer: Self-pay | Admitting: Internal Medicine

## 2021-09-18 NOTE — Telephone Encounter (Signed)
Please let patient know this.

## 2021-11-06 ENCOUNTER — Other Ambulatory Visit: Payer: Self-pay | Admitting: Obstetrics and Gynecology

## 2021-11-06 DIAGNOSIS — Z1231 Encounter for screening mammogram for malignant neoplasm of breast: Secondary | ICD-10-CM

## 2021-12-23 ENCOUNTER — Ambulatory Visit: Payer: Medicare Other

## 2021-12-25 ENCOUNTER — Ambulatory Visit
Admission: RE | Admit: 2021-12-25 | Discharge: 2021-12-25 | Disposition: A | Discharge status: Home / Self Care | Payer: Medicare Other | Source: Ambulatory Visit | Attending: Obstetrics and Gynecology | Admitting: Obstetrics and Gynecology

## 2021-12-25 DIAGNOSIS — Z1231 Encounter for screening mammogram for malignant neoplasm of breast: Secondary | ICD-10-CM

## 2022-03-25 ENCOUNTER — Encounter: Payer: Self-pay | Admitting: Internal Medicine

## 2022-03-25 LAB — HM COLONOSCOPY

## 2022-04-20 NOTE — Progress Notes (Unsigned)
No chief complaint on file.   HPI: Patient  Patricia Potts  71 y.o. comes in today for yearly check and med  S/p hip replacement . BCCA removal.  Mohs on scalp.  Doxy got aspirated  had bronchoscopy Arthritis   finger cyst   Kuzma  Rash setroid cream  from large local reaction to bites.  Zyrtec  .   Colon  was fine.   Maternal gp died of  colon cancer    5 years fu.  Dexa ina few weeks.  Health Maintenance  Topic Date Due   Pneumonia Vaccine 63+ Years old (1 - PCV) 09/24/2017   DEXA SCAN  Never done   COVID-19 Vaccine (5 - Booster for Pfizer series) 09/21/2021   INFLUENZA VACCINE  05/20/2022   MAMMOGRAM  12/26/2023   COLONOSCOPY (Pts 45-85yr Insurance coverage will need to be confirmed)  03/26/2027   TETANUS/TDAP  10/23/2029   Hepatitis C Screening  Completed   Zoster Vaccines- Shingrix  Completed   HPV VACCINES  Aged Out   Health Maintenance Review LIFESTYLE:  Exercise:   Tobacco/ETS: Alcohol:  Sugar beverages: Sleep: Drug use: no HH of  Work:    ROS:  GEN/ HEENT: No fever, significant weight changes sweats headaches vision problems hearing changes, CV/ PULM; No chest pain shortness of breath cough, syncope,edema  change in exercise tolerance. GI /GU: No adominal pain, vomiting, change in bowel habits. No blood in the stool. No significant GU symptoms. SKIN/HEME: ,no acute skin rashes suspicious lesions or bleeding. No lymphadenopathy, nodules, masses.  NEURO/ PSYCH:  No neurologic signs such as weakness numbness. No depression anxiety. IMM/ Allergy: No unusual infections.  Allergy .   REST of 12 system review negative except as per HPI   Past Medical History:  Diagnosis Date   Actinic keratosis    sees derm 2 x per wyear.   Chickenpox    Migraine    as teen young adult hormonal   Osteopenia    Dexa Scan per gyne   SCCA (squamous cell carcinoma) of skin    Scoliosis    Seasonal allergies    Uterine polyp     Past Surgical History:  Procedure  Laterality Date   CESAREAN SECTION  [phone]  CYST EXCISION Left 02/28/2021   Procedure: EXCISION CYST LEFT MIDDLE FINGER, DEBRIDEMENT DISTAL INTERPHALANGEAL JOINT;  Surgeon: KDaryll Brod MD;  [facility]  Service: Orthopedics;  Laterality: Left;  60 MINUTES   Mucoid Cysts     Removed from both hands and fingers sypher    polyp     uterine removal    TOTAL HIP ARTHROPLASTY     left   TUBAL LIGATION  1989    Family History  Problem Relation Age of Onset   Cancer Mother        Squamous cell cancer   Coronary artery disease Father        died in 929s had  pvd    Hyperlipidemia Father    Hypertension Father    Pulmonary embolism Brother        [facility]    Cancer Maternal Grandmother        Colon cancer   Cancer Maternal Grandfather        Colon cancer   Down syndrome Son    Breast cancer Neg Hx     Social History   Socioeconomic History   Marital status: Married  Spouse name: Not on file   Number of children: 3   Years of education: Not on file   Highest education level: Not on file  Occupational History   Occupation: Therapist, sports    Comment: Retired  Tobacco Use   Smoking status: Never   Smokeless tobacco: Never  Substance and Sexual Activity   Alcohol use: Yes   Drug use: Not on file   Sexual activity: Not on file  Other Topics Concern   Not on file  Social History Narrative   Married    HH of 3    RN Masters level retired homemaker Physicist, medical   No pets    G3P3   Soc etoh no tobacco         Social Determinants of Radio broadcast assistant Strain: Not on Art therapist Insecurity: Not on file  Transportation Needs: Not on file  Physical Activity: Not on file  Stress: Not on file  Social Connections: Not on file    Outpatient Medications Prior to Visit  Medication Sig Dispense Refill   amoxicillin (AMOXIL) 500 MG capsule Take 2,000 mg by mouth once.     calcium carbonate (OSCAL) 1500 (600 Ca) MG TABS  tablet Take by mouth 2 (two) times daily with a meal.     cetirizine (ZYRTEC) 10 MG tablet Take 10 mg by mouth daily.     clobetasol cream (TEMOVATE) 5.36 % Apply 1 application topically 2 (two) times daily. To effected area 15 g 0   dicyclomine (BENTYL) 20 MG tablet Take 1 tablet (20 mg total) by mouth 3 (three) times daily as needed for spasms. (Patient not taking: No sig reported) 20 tablet 0   Multiple Vitamins-Minerals (CENTRUM SILVER PO) Take by mouth.     pantoprazole (PROTONIX) 20 MG tablet Take 1 tablet (20 mg total) by mouth daily. (Patient not taking: No sig reported) 14 tablet 0   polyethylene glycol (MIRALAX / GLYCOLAX) packet Take 17 g by mouth daily. 14 each 0   predniSONE (DELTASONE) 20 MG tablet taper     traMADol (ULTRAM) 50 MG tablet Take 1 tablet (50 mg total) by mouth every 6 (six) hours as needed. 20 tablet 0   No facility-administered medications prior to visit.     EXAM:  LMP 08/07/2011 Comment: tubal ligation  There is no height or weight on file to calculate BMI. Wt Readings from Last 3 Encounters:  02/28/21 127 lb 13.9 oz (58 kg)  09/04/20 126 lb 9.6 oz (57.4 kg)  12/28/17 125 lb (56.7 kg)    Physical Exam: Vital signs reviewed RWE:RXVQ is a well-developed well-nourished alert cooperative    who appearsr stated age in no acute distress.  HEENT: normocephalic atraumatic , Eyes: PERRL EOM's full, conjunctiva clear, Nares: paten,t no deformity discharge or tenderness., Ears: no deformity EAC's clear TMs with normal landmarks. Mouth: clear OP, no lesions, edema.  Moist mucous membranes. Dentition in adequate repair. NECK: supple without masses, thyromegaly or bruits. CHEST/PULM:  Clear to auscultation and percussion breath sounds equal no wheeze , rales or rhonchi. No chest wall deformities or tenderness. Breast: normal by inspection . No dimpling, discharge, masses, tenderness or discharge . CV: PMI is nondisplaced, S1 S2 no gallops, murmurs, rubs. Peripheral  pulses are full without delay.No JVD .  ABDOMEN: Bowel sounds normal nontender  No guard or rebound, no hepato splenomegal no CVA tenderness.  No hernia. Extremtities:  No clubbing cyanosis or edema, no acute joint swelling or redness no focal  atrophy NEURO:  Oriented x3, cranial nerves 3-12 appear to be intact, no obvious focal weakness,gait within normal limits no abnormal reflexes or asymmetrical SKIN: No acute rashes normal turgor, color, no bruising or petechiae. PSYCH: Oriented, good eye contact, no obvious depression anxiety, cognition and judgment appear normal. LN: no cervical axillary inguinal adenopathy  Lab Results  Component Value Date   WBC 5.1 09/04/2020   HGB 14.0 09/04/2020   HCT 41.3 09/04/2020   PLT 224 09/04/2020   GLUCOSE 95 09/04/2020   CHOL 194 09/04/2020   TRIG 83 09/04/2020   HDL 78 09/04/2020   LDLCALC 99 09/04/2020   ALT 13 09/04/2020   AST 21 09/04/2020   NA 140 09/04/2020   K 4.4 09/04/2020   CL 103 09/04/2020   CREATININE 0.76 09/04/2020   BUN 18 09/04/2020   CO2 29 09/04/2020   TSH 0.77 10/15/2016    BP Readings from Last 3 Encounters:  02/28/21 119/77  09/04/20 140/74  12/28/17 (!) 114/57    Lab results reviewed with patient   ASSESSMENT AND PLAN:  Discussed the following assessment and plan:  No diagnosis found. No follow-ups on file.  Patient Care Team: Shellyann Wandrey, Standley Brooking, MD as PCP - General Dian Queen, MD as Attending Physician (Obstetrics and Gynecology) [name], Amy, MD as Consulting Physician (Dermatology) Sharyne Peach, MD as Attending Physician (Ophthalmology) Suella Broad, MD as Consulting Physician (Physical Medicine and Rehabilitation) There are no Patient Instructions on file for this visit.  Standley Brooking. Ginnie Marich M.D.

## 2022-04-21 ENCOUNTER — Ambulatory Visit (INDEPENDENT_AMBULATORY_CARE_PROVIDER_SITE_OTHER): Payer: Medicare Other | Admitting: Internal Medicine

## 2022-04-21 ENCOUNTER — Encounter: Payer: Self-pay | Admitting: Internal Medicine

## 2022-04-21 VITALS — BP 114/72 | HR 66 | Temp 98.1°F | Ht 64.25 in | Wt 126.6 lb

## 2022-04-21 DIAGNOSIS — Z79899 Other long term (current) drug therapy: Secondary | ICD-10-CM

## 2022-04-21 DIAGNOSIS — M8580 Other specified disorders of bone density and structure, unspecified site: Secondary | ICD-10-CM

## 2022-04-21 DIAGNOSIS — M1990 Unspecified osteoarthritis, unspecified site: Secondary | ICD-10-CM | POA: Diagnosis not present

## 2022-04-21 DIAGNOSIS — E7800 Pure hypercholesterolemia, unspecified: Secondary | ICD-10-CM | POA: Diagnosis not present

## 2022-04-21 LAB — CBC WITH DIFFERENTIAL/PLATELET
Basophils Absolute: 0.1 10*3/uL (ref 0.0–0.1)
Basophils Relative: 1.7 % (ref 0.0–3.0)
Eosinophils Absolute: 0.1 10*3/uL (ref 0.0–0.7)
Eosinophils Relative: 2.2 % (ref 0.0–5.0)
HCT: 40.8 % (ref 36.0–46.0)
Hemoglobin: 13.6 g/dL (ref 12.0–15.0)
Lymphocytes Relative: 35.7 % (ref 12.0–46.0)
Lymphs Abs: 1.8 10*3/uL (ref 0.7–4.0)
MCHC: 33.3 g/dL (ref 30.0–36.0)
MCV: 92.6 fl (ref 78.0–100.0)
Monocytes Absolute: 0.3 10*3/uL (ref 0.1–1.0)
Monocytes Relative: 6.6 % (ref 3.0–12.0)
Neutro Abs: 2.7 10*3/uL (ref 1.4–7.7)
Neutrophils Relative %: 53.8 % (ref 43.0–77.0)
Platelets: 190 10*3/uL (ref 150.0–400.0)
RBC: 4.41 Mil/uL (ref 3.87–5.11)
RDW: 13 % (ref 11.5–15.5)
WBC: 5 10*3/uL (ref 4.0–10.5)

## 2022-04-21 LAB — BASIC METABOLIC PANEL
BUN: 18 mg/dL (ref 6–23)
CO2: 27 mEq/L (ref 19–32)
Calcium: 9.5 mg/dL (ref 8.4–10.5)
Chloride: 103 mEq/L (ref 96–112)
Creatinine, Ser: 0.69 mg/dL (ref 0.40–1.20)
GFR: 88.46 mL/min (ref >60.00)
Glucose, Bld: 85 mg/dL (ref 70–99)
Potassium: 3.6 mEq/L (ref 3.5–5.1)
Sodium: 139 mEq/L (ref 135–145)

## 2022-04-21 LAB — LIPID PANEL
Cholesterol: 206 mg/dL — ABNORMAL HIGH (ref 0–200)
HDL: 71.8 mg/dL (ref >39.00)
LDL Cholesterol: 115 mg/dL — ABNORMAL HIGH (ref 0–99)
NonHDL: 133.99
Total CHOL/HDL Ratio: 3
Triglycerides: 95 mg/dL (ref 0.0–149.0)
VLDL: 19 mg/dL (ref 0.0–40.0)

## 2022-04-21 LAB — TSH: TSH: 0.52 u[IU]/mL (ref 0.35–5.50)

## 2022-04-21 NOTE — Progress Notes (Signed)
Results normal or favorable  ldl  could be better but still low 10 years risk  The 10-year ASCVD risk score (Arnett DK, et al., 2019) is: 6.5%   Values used to calculate the score:     Age: 71 years     Sex: Female     Is Non-Hispanic African American: No     Diabetic: No     Tobacco smoker: No     Systolic Blood Pressure: 014 mmHg     Is BP treated: No     HDL Cholesterol: 71.8 mg/dL     Total Cholesterol: 206 mg/dL

## 2022-04-21 NOTE — Patient Instructions (Signed)
Good to see you today .  Lab pending . Continue lifestyle intervention healthy eating and exercise .

## 2022-04-22 ENCOUNTER — Encounter: Payer: Self-pay | Admitting: Internal Medicine

## 2022-04-29 IMAGING — MG MM DIGITAL SCREENING BILAT W/ TOMO AND CAD
6 of 10 series · 6 of 30 positions shown · non-contrast
Comparison: Previous exam(s).

CLINICAL DATA: Screening.

EXAM:
DIGITAL SCREENING BILATERAL MAMMOGRAM WITH TOMOSYNTHESIS AND CAD
TECHNIQUE: Bilateral screening digital craniocaudal and mediolateral oblique
mammograms were obtained. Bilateral screening digital breast
tomosynthesis was performed. The images were evaluated with
computer-aided detection.

[R MLO synth-2D]
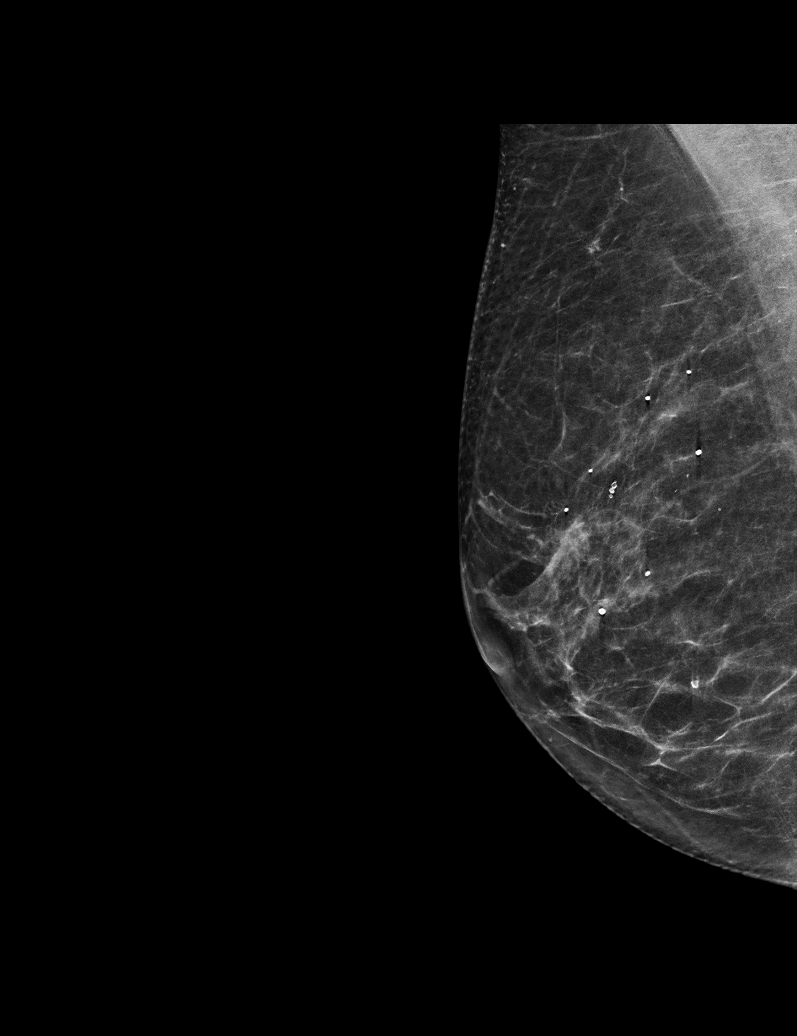

[L CC synth-2D (1 of 2)]
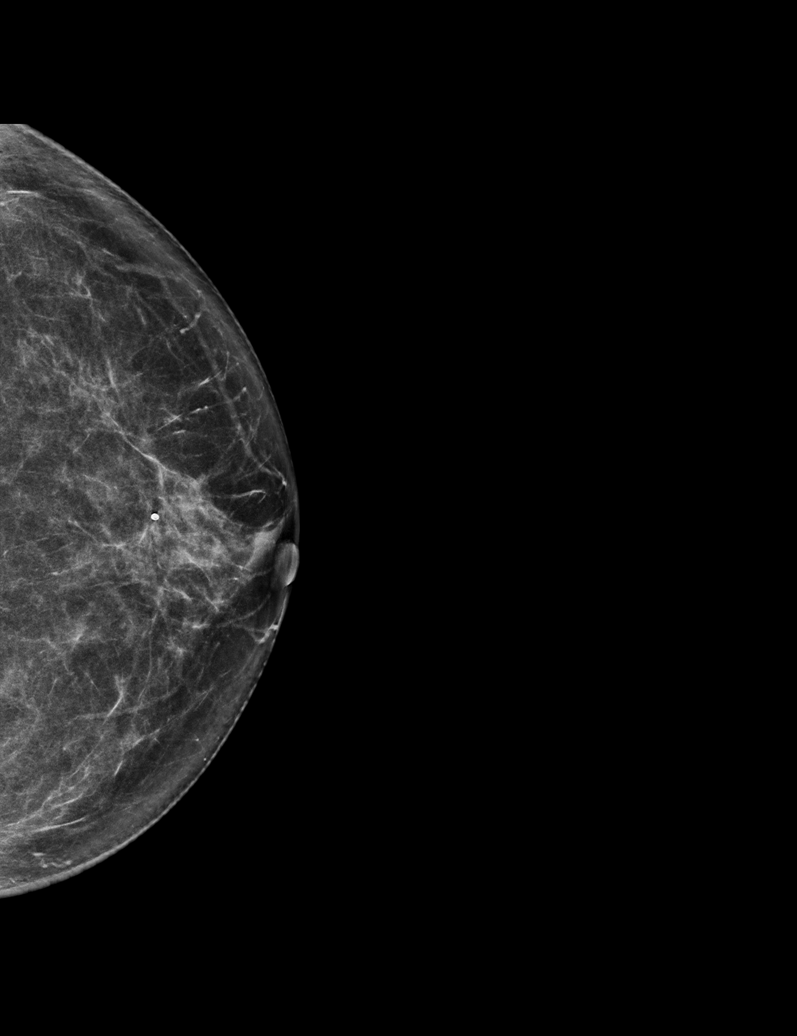

[L MLO synth-2D]
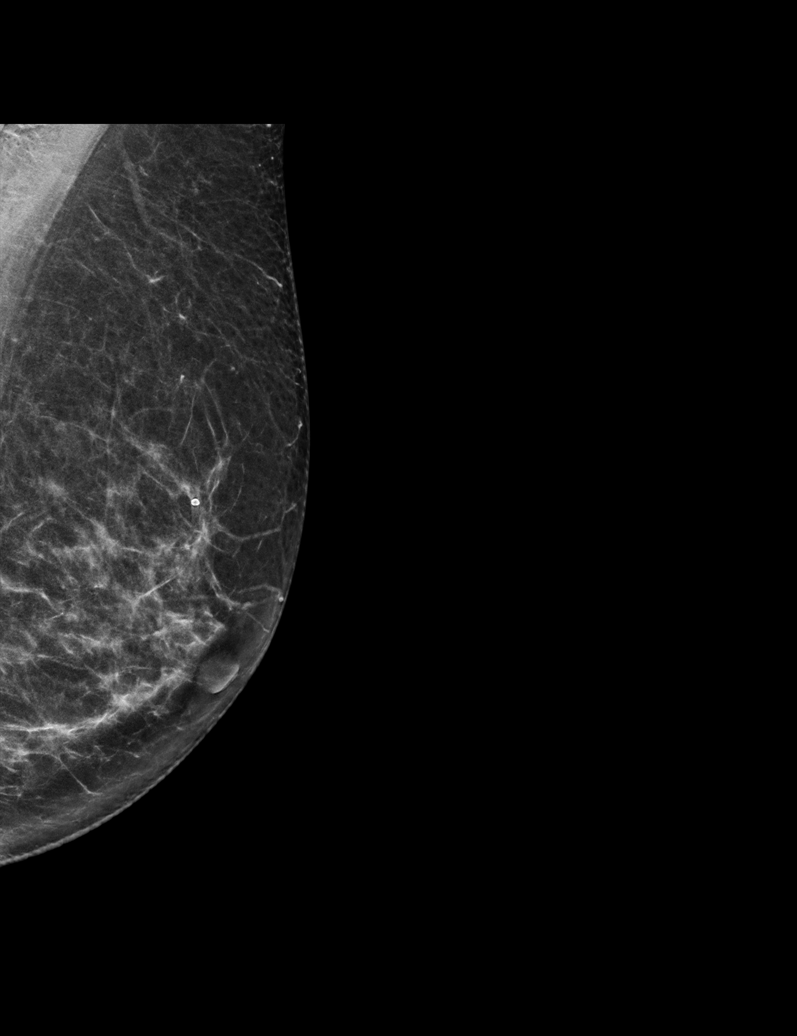

[L CC synth-2D (2 of 2)]
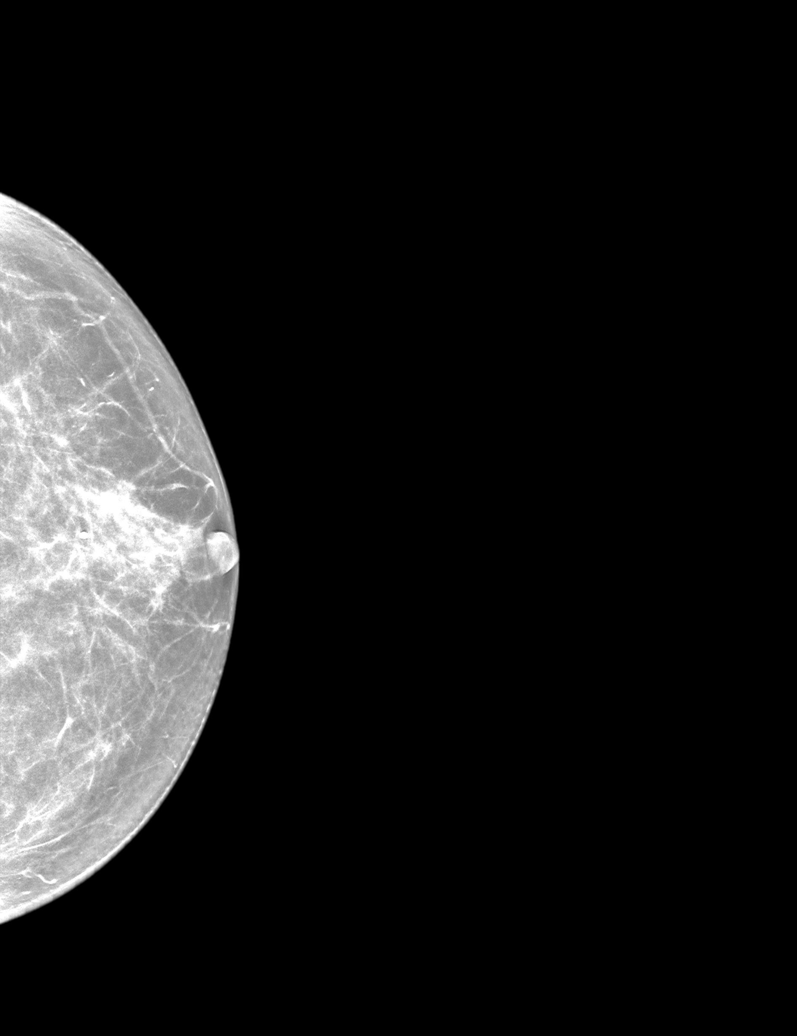

[R CC synth-2D]
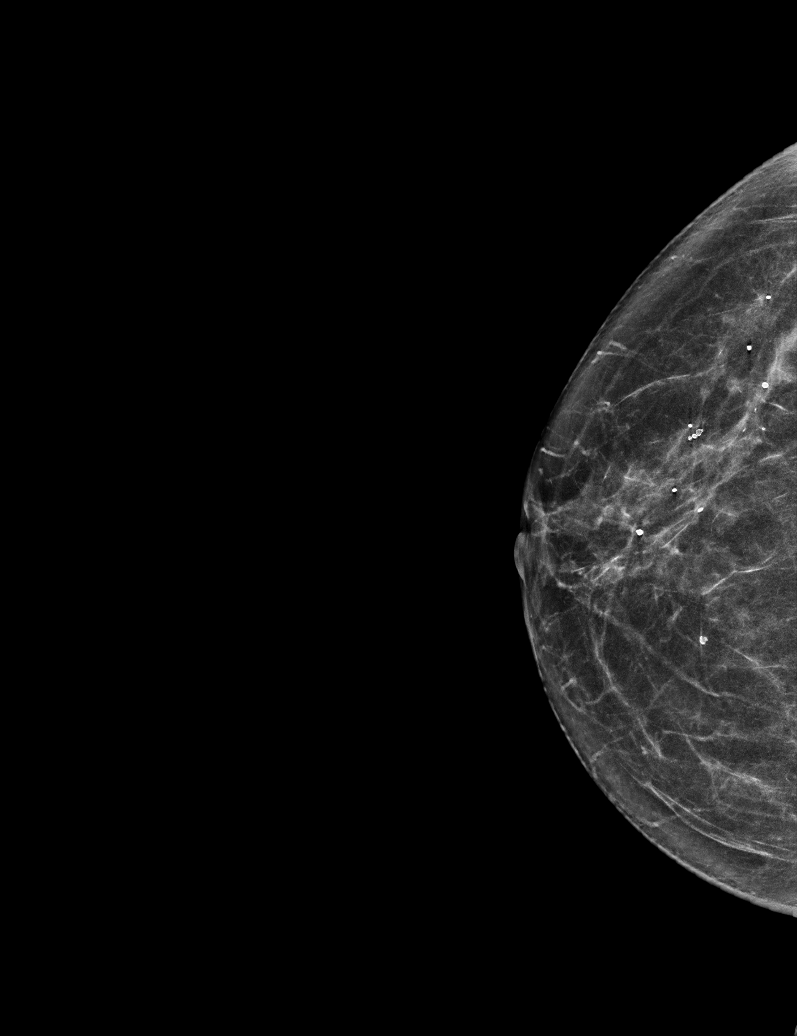

[L CC tomo · tomo slice 31/60.0]
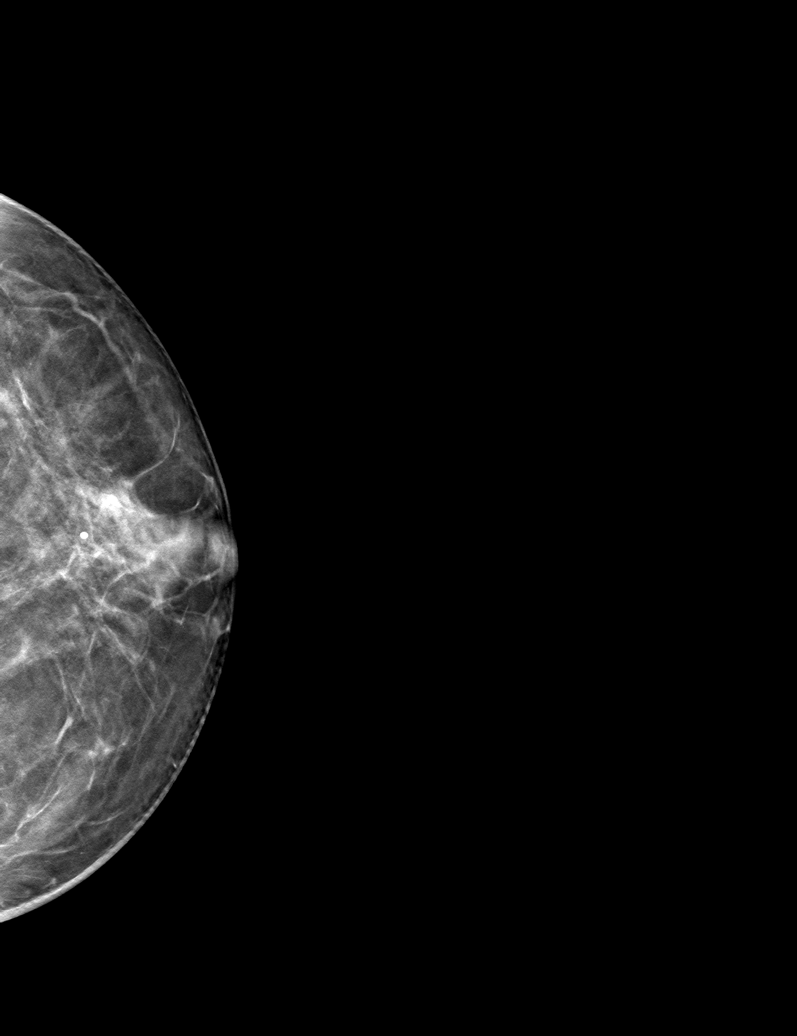

[6 of 30 positions shown; findings below may reference images not displayed]

ACR Breast Density Category b: There are scattered areas of
fibroglandular density.
FINDINGS: There are no findings suspicious for malignancy.
IMPRESSION: No mammographic evidence of malignancy. A result letter of this
screening mammogram will be mailed directly to the patient.

RECOMMENDATION:
Screening mammogram in one year. (Code:51-O-LD2)

BI-RADS CATEGORY  1: Negative.

## 2022-07-22 ENCOUNTER — Encounter: Payer: Self-pay | Admitting: Internal Medicine

## 2022-11-12 ENCOUNTER — Other Ambulatory Visit: Payer: Self-pay | Admitting: Obstetrics and Gynecology

## 2022-11-12 DIAGNOSIS — Z1231 Encounter for screening mammogram for malignant neoplasm of breast: Secondary | ICD-10-CM

## 2023-01-05 ENCOUNTER — Ambulatory Visit: Payer: Medicare Other

## 2023-02-19 ENCOUNTER — Ambulatory Visit
Admission: RE | Admit: 2023-02-19 | Discharge: 2023-02-19 | Disposition: A | Discharge status: Home / Self Care | Payer: Medicare Other | Source: Ambulatory Visit | Attending: Obstetrics and Gynecology | Admitting: Obstetrics and Gynecology

## 2023-02-19 DIAGNOSIS — Z1231 Encounter for screening mammogram for malignant neoplasm of breast: Secondary | ICD-10-CM

## 2023-04-27 NOTE — Progress Notes (Signed)
No chief complaint on file.   HPI: Patient  Patricia Potts  72 y.o. comes in today for  yearly visit   OA djd   saw dr Corliss Marcus    Health Maintenance  Topic Date Due   Medicare Annual Wellness (AWV)  Never done   Pneumonia Vaccine 52+ Years old (1 of 1 - PCV) 09/24/2017   DEXA SCAN  Never done   COVID-19 Vaccine (8 - 2023-24 season) 09/06/2022   INFLUENZA VACCINE  05/21/2023   MAMMOGRAM  02/18/2025   Colonoscopy  03/26/2027   DTaP/Tdap/Td (3 - Td or Tdap) 10/23/2029   Hepatitis C Screening  Completed   Zoster Vaccines- Shingrix  Completed   HPV VACCINES  Aged Out   Health Maintenance Review LIFESTYLE:  Exercise:   Tobacco/ETS: Alcohol:  Sugar beverages: Sleep: Drug use: no HH of  Work:    ROS:  GEN/ HEENT: No fever, significant weight changes sweats headaches vision problems hearing changes, CV/ PULM; No chest pain shortness of breath cough, syncope,edema  change in exercise tolerance. GI /GU: No adominal pain, vomiting, change in bowel habits. No blood in the stool. No significant GU symptoms. SKIN/HEME: ,no acute skin rashes suspicious lesions or bleeding. No lymphadenopathy, nodules, masses.  NEURO/ PSYCH:  No neurologic signs such as weakness numbness. No depression anxiety. IMM/ Allergy: No unusual infections.  Allergy .   REST of 12 system review negative except as per HPI   Past Medical History:  Diagnosis Date   Actinic keratosis    sees derm 2 x per wyear.   Chickenpox    Migraine    as teen young adult hormonal   Osteopenia    Dexa Scan per gyne   SCCA (squamous cell carcinoma) of skin    Scoliosis    Seasonal allergies    Uterine polyp     Past Surgical History:  Procedure Laterality Date   CESAREAN SECTION  [phone]   CYST EXCISION Left 02/28/2021   Procedure: EXCISION CYST LEFT MIDDLE FINGER, DEBRIDEMENT DISTAL INTERPHALANGEAL JOINT;  Surgeon: Cindee Salt, MD;  Location: [facility];  Service: Orthopedics;   Laterality: Left;  60 MINUTES   Mucoid Cysts     Removed from both hands and fingers sypher    polyp     uterine removal    TOTAL HIP ARTHROPLASTY     left   TUBAL LIGATION  1989    Family History  Problem Relation Age of Onset   Cancer Mother        Squamous cell cancer   Coronary artery disease Father        died in 37s  had  pvd    Hyperlipidemia Father    Hypertension Father    Pulmonary embolism Brother        evaluatied at [facility] for hypercoag state    Cancer Maternal Grandmother        Colon cancer   Cancer Maternal Grandfather        Colon cancer   Down syndrome Son    Breast cancer Neg Hx     Social History   Socioeconomic History   Marital status: Married    Spouse name: Not on file   Number of children: 3   Years of education: Not on file   Highest education level: Not on file  Occupational History   Occupation: RN    Comment: Retired  Tobacco Use   Smoking status: Never   Smokeless  tobacco: Never  Substance and Sexual Activity   Alcohol use: Yes   Drug use: Not on file   Sexual activity: Not on file  Other Topics Concern   Not on file  Social History Narrative   Married    HH of 3    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     cetirizine (ZYRTEC) 10 MG tablet Take 10 mg by mouth daily.     clobetasol cream (TEMOVATE) 0.05 % Apply 1 application topically 2 (two) times daily. To effected area 15 g 0   Multiple Vitamins-Minerals (CENTRUM SILVER PO) Take by mouth.     No facility-administered  medications prior to visit.     EXAM:  LMP 08/07/2011 Comment: tubal ligation  There is no height or weight on file to calculate BMI. Wt Readings from Last 3 Encounters:  04/21/22 126 lb 9.6 oz (57.4 kg)  02/28/21 127 lb 13.9 oz (58 kg)  09/04/20 126 lb 9.6 oz (57.4 kg)    Physical Exam: Vital signs reviewed UJW:[id number] is a well-developed well-nourished alert cooperative    who appearsr stated age in no acute distress.  HEENT: normocephalic atraumatic , Eyes: PERRL EOM's full, conjunctiva clear, Nares: paten,t no deformity discharge or tenderness., Ears: no deformity EAC's clear TMs with normal landmarks. Mouth: clear OP, no lesions, edema.  Moist mucous membranes. Dentition in adequate repair. NECK: supple without masses, thyromegaly or bruits. CHEST/PULM:  Clear to auscultation and percussion breath sounds equal no wheeze , rales or rhonchi. No chest wall deformities or tenderness. Breast: normal by inspection . No dimpling, discharge, masses, tenderness or discharge . CV: PMI is nondisplaced, S1 S2 no gallops, murmurs, rubs. Peripheral pulses are full without delay.No JVD .  ABDOMEN: Bowel sounds normal nontender  No guard or rebound, no hepato splenomegal no CVA tenderness.  No hernia. Extremtities:  No clubbing cyanosis or edema, no acute joint swelling or redness no focal atrophy NEURO:  Oriented x3, cranial nerves 3-12 appear to be intact, no obvious focal weakness,gait within normal limits no abnormal reflexes or asymmetrical SKIN: No acute rashes normal turgor, color, no bruising or petechiae. PSYCH: Oriented, good eye contact, no obvious depression anxiety, cognition and judgment appear normal. LN: no cervical axillary inguinal adenopathy  Lab Results  Component Value Date   WBC 5.0 04/21/2022   HGB 13.6 04/21/2022   HCT 40.8 04/21/2022   PLT 190.0 04/21/2022   GLUCOSE 85 04/21/2022   CHOL 206 (H) 04/21/2022   TRIG 95.0 04/21/2022   HDL 71.80 04/21/2022   LDLCALC 115  (H) 04/21/2022   ALT 13 09/04/2020   AST 21 09/04/2020   NA 139 04/21/2022   K 3.6 04/21/2022   CL 103 04/21/2022   CREATININE 0.69 04/21/2022   BUN 18 04/21/2022   CO2 27 04/21/2022   TSH 0.52 04/21/2022    BP Readings from Last 3 Encounters:  04/21/22 114/72  02/28/21 119/77  09/04/20  140/74    Lab results reviewed with patient   ASSESSMENT AND PLAN:  Discussed the following assessment and plan:    ICD-10-CM   1. Osteopenia, unspecified location  M85.80     2. Allergic rhinitis, unspecified seasonality, unspecified trigger  J30.9     3. Medication management  Z79.899     4. Elevated LDL cholesterol level  E78.00      No follow-ups on file.  Patient Care Team: Sanjith Siwek, Neta Mends, MD as PCP - General Marcelle Overlie, MD as Attending Physician (Obstetrics and Gynecology) [place], Amy, MD as Consulting Physician (Dermatology) Elise Benne, MD as Attending Physician (Ophthalmology) Sheran Luz, MD as Consulting Physician (Physical Medicine and Rehabilitation) There are no Patient Instructions on file for this visit.  Neta Mends. Michell Giuliano M.D.

## 2023-04-28 ENCOUNTER — Encounter: Payer: Self-pay | Admitting: Internal Medicine

## 2023-04-28 ENCOUNTER — Ambulatory Visit (INDEPENDENT_AMBULATORY_CARE_PROVIDER_SITE_OTHER): Payer: Medicare Other | Admitting: Internal Medicine

## 2023-04-28 VITALS — BP 120/72 | HR 75 | Temp 98.1°F | Ht 64.25 in | Wt 126.0 lb

## 2023-04-28 DIAGNOSIS — J309 Allergic rhinitis, unspecified: Secondary | ICD-10-CM

## 2023-04-28 DIAGNOSIS — Z8262 Family history of osteoporosis: Secondary | ICD-10-CM

## 2023-04-28 DIAGNOSIS — E7800 Pure hypercholesterolemia, unspecified: Secondary | ICD-10-CM | POA: Diagnosis not present

## 2023-04-28 DIAGNOSIS — M8580 Other specified disorders of bone density and structure, unspecified site: Secondary | ICD-10-CM | POA: Diagnosis not present

## 2023-04-28 DIAGNOSIS — Z79899 Other long term (current) drug therapy: Secondary | ICD-10-CM

## 2023-04-28 DIAGNOSIS — Z8249 Family history of ischemic heart disease and other diseases of the circulatory system: Secondary | ICD-10-CM | POA: Diagnosis not present

## 2023-04-28 LAB — BASIC METABOLIC PANEL
BUN: 20 mg/dL (ref 6–23)
CO2: 29 mEq/L (ref 19–32)
Calcium: 9.7 mg/dL (ref 8.4–10.5)
Chloride: 102 mEq/L (ref 96–112)
Creatinine, Ser: 0.75 mg/dL (ref 0.40–1.20)
GFR: 80.57 mL/min (ref >60.00)
Glucose, Bld: 89 mg/dL (ref 70–99)
Potassium: 4 mEq/L (ref 3.5–5.1)
Sodium: 138 mEq/L (ref 135–145)

## 2023-04-28 LAB — CBC WITH DIFFERENTIAL/PLATELET
Basophils Absolute: 0.1 10*3/uL (ref 0.0–0.1)
Basophils Relative: 1.4 % (ref 0.0–3.0)
Eosinophils Absolute: 0.1 10*3/uL (ref 0.0–0.7)
Eosinophils Relative: 2 % (ref 0.0–5.0)
HCT: 41.4 % (ref 36.0–46.0)
Hemoglobin: 13.6 g/dL (ref 12.0–15.0)
Lymphocytes Relative: 37.2 % (ref 12.0–46.0)
Lymphs Abs: 1.7 10*3/uL (ref 0.7–4.0)
MCHC: 32.9 g/dL (ref 30.0–36.0)
MCV: 93.2 fl (ref 78.0–100.0)
Monocytes Absolute: 0.4 10*3/uL (ref 0.1–1.0)
Monocytes Relative: 8.1 % (ref 3.0–12.0)
Neutro Abs: 2.4 10*3/uL (ref 1.4–7.7)
Neutrophils Relative %: 51.3 % (ref 43.0–77.0)
Platelets: 204 10*3/uL (ref 150.0–400.0)
RBC: 4.44 Mil/uL (ref 3.87–5.11)
RDW: 12.7 % (ref 11.5–15.5)
WBC: 4.6 10*3/uL (ref 4.0–10.5)

## 2023-04-28 LAB — HEPATIC FUNCTION PANEL
ALT: 15 U/L (ref 0–35)
AST: 21 U/L (ref 0–37)
Albumin: 4.5 g/dL (ref 3.5–5.2)
Alkaline Phosphatase: 71 U/L (ref 39–117)
Bilirubin, Direct: 0.1 mg/dL (ref 0.0–0.3)
Total Bilirubin: 0.8 mg/dL (ref 0.2–1.2)
Total Protein: 7.3 g/dL (ref 6.0–8.3)

## 2023-04-28 LAB — LIPID PANEL
Cholesterol: 200 mg/dL (ref 0–200)
HDL: 72.6 mg/dL (ref >39.00)
LDL Cholesterol: 114 mg/dL — ABNORMAL HIGH (ref 0–99)
NonHDL: 126.96
Total CHOL/HDL Ratio: 3
Triglycerides: 66 mg/dL (ref 0.0–149.0)
VLDL: 13.2 mg/dL (ref 0.0–40.0)

## 2023-04-28 LAB — TSH: TSH: 0.51 u[IU]/mL (ref 0.35–5.50)

## 2023-04-28 NOTE — Patient Instructions (Signed)
Good to see you today . Labs  Continue lifestyle intervention healthy eating and exercise . Get some  resistance training   on regular basis for bone health.  Let us know if you wish to get a   ct coronary calcium score for risk assessment . ( Not compelling reason)

## 2023-04-30 LAB — LIPOPROTEIN A (LPA): Lipoprotein (a): 42 nmol/L (ref <75)

## 2023-05-11 NOTE — Progress Notes (Signed)
Lipo a in  optimal range . Rest of labs in range . Continue lifestyle intervention healthy eating and exercise .

## 2023-07-05 ENCOUNTER — Encounter: Payer: Self-pay | Admitting: Internal Medicine

## 2023-10-19 ENCOUNTER — Telehealth: Payer: Self-pay | Admitting: Internal Medicine

## 2023-10-19 NOTE — Telephone Encounter (Signed)
Pt has jury summons to appear on 12-16-2023 for jury duty number [phone]. 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

## 2023-10-19 NOTE — Telephone Encounter (Signed)
Copied from CRM [id number]. Topic: General - Other >> Oct 19, 2023 12:16 PM [name] wrote: Reason for CRM: patient is primary caregiver for adult child ([place], DOB 08/01/1985) and needs brief letter for [employer] stating that she is the primary caregiver. She would be happy to come pick it up or if you want to send it electronically she has MyChart set up.

## 2023-10-27 NOTE — Telephone Encounter (Signed)
 Pt is calling checking on update to jury letter

## 2023-10-28 NOTE — Telephone Encounter (Signed)
 Letter was composed and posted to pt's mychart.   Spoke to pt and pt is aware.   Pt would like to thank Dr. Fabian Sharp for the letter.

## 2023-10-28 NOTE — Telephone Encounter (Signed)
 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 Thanks

## 2024-01-18 ENCOUNTER — Other Ambulatory Visit: Payer: Self-pay | Admitting: Obstetrics and Gynecology

## 2024-01-18 DIAGNOSIS — Z1231 Encounter for screening mammogram for malignant neoplasm of breast: Secondary | ICD-10-CM

## 2024-02-24 ENCOUNTER — Ambulatory Visit
Admission: RE | Admit: 2024-02-24 | Discharge: 2024-02-24 | Disposition: A | Discharge status: Home / Self Care | Source: Ambulatory Visit | Attending: Obstetrics and Gynecology | Admitting: Obstetrics and Gynecology

## 2024-02-24 DIAGNOSIS — Z1231 Encounter for screening mammogram for malignant neoplasm of breast: Secondary | ICD-10-CM

## 2024-04-28 ENCOUNTER — Ambulatory Visit (INDEPENDENT_AMBULATORY_CARE_PROVIDER_SITE_OTHER): Admitting: Internal Medicine

## 2024-04-28 ENCOUNTER — Encounter: Payer: Self-pay | Admitting: Internal Medicine

## 2024-04-28 VITALS — BP 108/66 | HR 84 | Temp 98.0°F | Ht 64.25 in | Wt 127.0 lb

## 2024-04-28 DIAGNOSIS — Z79899 Other long term (current) drug therapy: Secondary | ICD-10-CM | POA: Diagnosis not present

## 2024-04-28 DIAGNOSIS — K219 Gastro-esophageal reflux disease without esophagitis: Secondary | ICD-10-CM

## 2024-04-28 DIAGNOSIS — R12 Heartburn: Secondary | ICD-10-CM

## 2024-04-28 MED ORDER — ESOMEPRAZOLE MAGNESIUM 20 MG PO CPDR: 20.0000 mg | DELAYED_RELEASE_CAPSULE | Freq: Every day | ORAL | 3 refills | Status: AC

## 2024-04-28 NOTE — Progress Notes (Signed)
 Chief Complaint  Patient presents with   Heartburn    Pt c/o ongoing heartburn for years. Off and on. Seem worsen over the last 6 months. Pt reports she has osteopenia and trying to take calcium, trying all kinds but has heartburn.     HPI: Patricia Potts 73 y.o. come in for  see above   Off an on HB over the years   very bad pre op for hip surgery  from nsaids  rx with ppi and better  . Since then   off and on sx but more recently worse . Calcium preps, all types tried ,  making worse .   No matter with kind  Gets some  cough upper airway .  No true dysphagia.  But can get midline burning  . And mucous feeling cough throught allergy in past  but  family member thought could be  gerd. Oass wakening of hb in night  Taking otc prilosec. For a week and then stops .   Not controlled sx.  No unintentional  weight loss .  ROS: See pertinent positives and negatives per HPI. No other cp sob syncope  other gi changes   Past Medical History:  Diagnosis Date   Actinic keratosis    sees derm 2 x per wyear.   Chickenpox    Migraine    as teen young adult hormonal   Osteopenia    Dexa Scan per gyne   SCCA (squamous cell carcinoma) of skin    Scoliosis    Seasonal allergies    Uterine polyp     Family History  Problem Relation Age of Onset   Cancer Mother        Squamous cell cancer   Coronary artery disease Father        died in 6s  had  pvd    Hyperlipidemia Father    Hypertension Father    Pulmonary embolism Brother        evaluatied at [facility] for hypercoag state    Cancer Maternal Grandmother        Colon cancer   Cancer Maternal Grandfather        Colon cancer   Down syndrome Son    Breast cancer Neg Hx     Social History   Socioeconomic History   Marital status: Married    Spouse name: Not on file   Number of children: 3   Years of education: Not on file   Highest education level: Not on file  Occupational History   Occupation: Charity fundraiser    Comment: Retired  Tobacco  Use   Smoking status: Never   Smokeless tobacco: Never  Substance and Sexual Activity   Alcohol use: Yes   Drug use: Not on file   Sexual activity: Not on file  Other Topics Concern   Not on file  Social History Narrative   Married    HH of 3    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     Multiple Vitamins-Minerals (CENTRUM SILVER PO) Take by mouth.     clobetasol  cream (TEMOVATE ) 0.05 % Apply 1 application topically 2 (two) times daily. To effected area (Patient not taking: Reported on 04/28/2024) 15 g 0   No facility-administered medications prior to visit.     EXAM:  BP 108/66 (BP Location: Right Arm, Patient Position: Sitting, Cuff Size: Normal)   Pulse 84   Temp 98 F (36.7 C)   Ht 5' 4.25 (1.632 m)   Wt 127 lb (57.6 kg)   LMP 08/07/2011 Comment: tubal ligation  SpO2 98%   BMI 21.63 kg/m   Body mass index is 21.63 kg/m. Wt Readings from Last 3 Encounters:  04/28/24 127 lb (57.6 kg)  04/28/23 126 lb (57.2 kg)  04/21/22 126 lb 9.6 oz (57.4 kg)    GENERAL: vitals reviewed and listed above, alert,  well,oriented, appears well hydrated and in no acute distress HEENT: atraumatic, conjunctiva  clear, no obvious abnormalities on inspection of external nose and ears   NECK: no obvious masses on inspection palpation  LUNGS: clear to auscultation bilaterally, no wheezes, rales or rhonchi, good air movement CV: HRRR, no clubbing cyanosis or  peripheral edema nl cap refill  Abdomen:  Sof,t normal bowel sounds without hepatosplenomegaly, no guarding rebound or  masses no CVA tenderness MS: moves all extremities without noticeable focal  abnormality PSYCH: pleasant and cooperative, no obvious depression or anxiety  BP Readings from Last 3 Encounters:  04/28/24 108/66  04/28/23 120/72  04/21/22 114/72    ASSESSMENT AND PLAN:  Discussed the following assessment and plan:  Gastroesophageal reflux disease, unspecified whether esophagitis present  Medication management  Heartburn Reg ppi trial  nexium  ordered  (if not covered can change ppi   omeprazole fail )   can try bid if needed in interim  Advise  Gi consults  consider endo or at least fu.  She will send message about gi preferences in referral . If not helpful we can change ppi in interim  -Patient advised to return or notify health care team  if  new concerns arise.  Patient Instructions  Begin a daily ppi  30 - 60 min pre meal.  If nexium  not covered we can try protonix   if covered ( failed omeprazole) .   Plan gi referral . Let me know preference  Plan 1 month virtual visit or message.  On meds [name] M.D.

## 2024-04-28 NOTE — Patient Instructions (Addendum)
 Begin a daily ppi  30 - 60 min pre meal.  If nexium  not covered we can try protonix   if covered ( failed omeprazole) .   Plan gi referral . Let me know preference  Plan 1 month virtual visit or message.  On meds

## 2024-05-16 ENCOUNTER — Encounter: Payer: Self-pay | Admitting: Internal Medicine

## 2024-05-16 DIAGNOSIS — R12 Heartburn: Secondary | ICD-10-CM

## 2024-05-16 DIAGNOSIS — K219 Gastro-esophageal reflux disease without esophagitis: Secondary | ICD-10-CM

## 2024-05-17 NOTE — Telephone Encounter (Signed)
 NOt sure   what is quicker    have her call and we can also  do a GI referral ( see last note  for dx problem )

## 2024-05-18 ENCOUNTER — Encounter: Payer: Self-pay | Admitting: Internal Medicine

## 2024-07-10 ENCOUNTER — Encounter: Payer: Self-pay | Admitting: Internal Medicine

## 2024-07-31 ENCOUNTER — Encounter: Payer: Self-pay | Admitting: Internal Medicine

## 2024-08-08 ENCOUNTER — Ambulatory Visit: Admitting: Internal Medicine

## 2024-09-26 ENCOUNTER — Encounter: Payer: Self-pay | Admitting: Nurse Practitioner

## 2024-09-26 ENCOUNTER — Ambulatory Visit: Admitting: Nurse Practitioner

## 2024-09-26 VITALS — BP 110/62 | HR 102 | Ht 65.0 in | Wt 130.0 lb

## 2024-09-26 DIAGNOSIS — K219 Gastro-esophageal reflux disease without esophagitis: Secondary | ICD-10-CM | POA: Insufficient documentation

## 2024-09-26 DIAGNOSIS — R059 Cough, unspecified: Secondary | ICD-10-CM | POA: Insufficient documentation
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
## 2024-09-26 NOTE — Patient Instructions (Signed)
 Continue OTC Nexium   You have been scheduled for an endoscopy. Please follow written instructions given to you at your visit today.  If you use inhalers (even only as needed), please bring them with you on the day of your procedure.  If you take any of the following medications, they will need to be adjusted prior to your procedure:   DO NOT TAKE 7 DAYS PRIOR TO TEST- Trulicity (dulaglutide) Ozempic, Wegovy (semaglutide) Mounjaro, Zepbound (tirzepatide) Bydureon Bcise (exanatide extended release)  DO NOT TAKE 1 DAY PRIOR TO YOUR TEST Rybelsus (semaglutide) Adlyxin (lixisenatide) Victoza (liraglutide) Byetta (exanatide) ___________________________________________________________________________  _______________________________________________________  If your blood pressure at your visit was 140/90 or greater, please contact your primary care physician to follow up on this.  _______________________________________________________  If you are age 71 or older, your body mass index should be between 23-30. Your Body mass index is 21.63 kg/m. If this is out of the aforementioned range listed, please consider follow up with your Primary Care Provider.  If you are age 14 or younger, your body mass index should be between 19-25. Your Body mass index is 21.63 kg/m. If this is out of the aformentioned range listed, please consider follow up with your Primary Care Provider.   ________________________________________________________  The [facility] providers would like to encourage you to use MYCHART to communicate with providers for non-urgent requests or questions.  Due to long hold times on the telephone, sending your provider a message by [facility] may be a faster and more efficient way to get a response.  Please allow 48 business hours for a response.  Please remember that this is for non-urgent requests.  _______________________________________________________  [facility]  is using a team-based approach to care.  Your team is made up of your doctor and two to three APPS. Our APPS (Nurse Practitioners and Physician Assistants) work with your physician to ensure care continuity for you. They are fully qualified to address your health concerns and develop a treatment plan. They communicate directly with your gastroenterologist to care for you. Seeing the Advanced Practice Practitioners on your physician's team can help you by facilitating care more promptly, often allowing for earlier appointments, access to diagnostic testing, procedures, and other specialty referrals.

## 2024-10-08 NOTE — Progress Notes (Signed)
 Agree with the assessment and plan as outlined by Elida Shawl, NP.  EGD reasonable to assess for esophagitis and help determine necessity of chronic PPI use in setting of osteoporosis.  Unlikely that the benefits of another colonoscopy outweigh risks, given patient's lack of polyps on multiple previous colonoscopies.   Joyelle Siedlecki E. Stacia, MD [facility]

## 2024-10-21 ENCOUNTER — Other Ambulatory Visit: Payer: Self-pay | Admitting: Gastroenterology

## 2024-10-21 ENCOUNTER — Ambulatory Visit: Admitting: Gastroenterology

## 2024-10-21 ENCOUNTER — Encounter: Payer: Self-pay | Admitting: Gastroenterology

## 2024-10-21 VITALS — BP 126/69 | HR 64 | Temp 96.6°F | Resp 16 | Ht 65.0 in | Wt 130.0 lb

## 2024-10-21 DIAGNOSIS — K317 Polyp of stomach and duodenum: Secondary | ICD-10-CM

## 2024-10-21 DIAGNOSIS — K219 Gastro-esophageal reflux disease without esophagitis: Secondary | ICD-10-CM | POA: Diagnosis not present

## 2024-10-21 MED ORDER — SODIUM CHLORIDE 0.9 % IV SOLN: 500.0000 mL | INTRAVENOUS | Status: DC

## 2024-10-21 MED ORDER — FAMOTIDINE 20 MG PO TABS: 20.0000 mg | ORAL_TABLET | Freq: Every day | ORAL | 1 refills | Status: AC

## 2024-10-21 NOTE — Patient Instructions (Signed)
 Resume previous diet.  Continue present medications.  Await pathology results.   Recommend trial of famotidine 20 mg PO daily for reflux symptoms (PPI relatively contraindicated given low bone density). Prescription sent.   YOU HAD AN ENDOSCOPIC PROCEDURE TODAY AT THE [facility]:   Refer to the procedure report that was given to you for any specific questions about what was found during the examination.  If the procedure report does not answer your questions, please call your gastroenterologist to clarify.  If you requested that your care partner not be given the details of your procedure findings, then the procedure report has been included in a sealed envelope for you to review at your convenience later.  YOU SHOULD EXPECT: Some feelings of bloating in the abdomen. Passage of more gas than usual.  Walking can help get rid of the air that was put into your GI tract during the procedure and reduce the bloating. If you had a lower endoscopy (such as a colonoscopy or flexible sigmoidoscopy) you may notice spotting of blood in your stool or on the toilet paper. If you underwent a bowel prep for your procedure, you may not have a normal bowel movement for a few days.  Please Note:  You might notice some irritation and congestion in your nose or some drainage.  This is from the oxygen used during your procedure.  There is no need for concern and it should clear up in a day or so.  SYMPTOMS TO REPORT IMMEDIATELY:  Following upper endoscopy (EGD)  Vomiting of blood or coffee ground material  New chest pain or pain under the shoulder blades  Painful or persistently difficult swallowing  New shortness of breath  Fever of 100F or higher  Black, tarry-looking stools  For urgent or emergent issues, a gastroenterologist can be reached at any hour by calling [phone]. Do not use MyChart messaging for urgent concerns.    DIET:  We do recommend a small meal at first, but then you may  proceed to your regular diet.  Drink plenty of fluids but you should avoid alcoholic beverages for 24 hours.  ACTIVITY:  You should plan to take it easy for the rest of today and you should NOT DRIVE or use heavy machinery until tomorrow (because of the sedation medicines used during the test).    FOLLOW UP: Our staff will call the number listed on your records the next business day following your procedure.  We will call around 7:15- 8:00 am to check on you and address any questions or concerns that you may have regarding the information given to you following your procedure. If we do not reach you, we will leave a message.     If any biopsies were taken you will be contacted by phone or by letter within the next 1-3 weeks.  Please call us  at [phone] if you have not heard about the biopsies in 3 weeks.    SIGNATURES/CONFIDENTIALITY: You and/or your care partner have signed paperwork which will be entered into your electronic medical record.  These signatures attest to the fact that that the information above on your After Visit Summary has been reviewed and is understood.  Full responsibility of the confidentiality of this discharge information lies with you and/or your care-partner.

## 2024-10-21 NOTE — Op Note (Addendum)
 [facility] Patient Name: Patricia Potts Procedure Date: 10/21/2024 9:07 AM MRN: [id number] Endoscopist: Glendia E. Stacia , MD, [id number] Age: 74 Referring MD:  Date of Birth: December 19, 1950 Gender: Female Account #: [id number] Procedure:                Upper GI endoscopy Indications:              Suspected esophageal reflux, Globus sensation Medicines:                Monitored Anesthesia Care Procedure:                Pre-Anesthesia Assessment:                           - Prior to the procedure, a History and Physical                            was performed, and patient medications and                            allergies were reviewed. The patient's tolerance of                            previous anesthesia was also reviewed. The risks                            and benefits of the procedure and the sedation                            options and risks were discussed with the patient.                            All questions were answered, and informed consent                            was obtained. Prior Anticoagulants: The patient has                            taken no anticoagulant or antiplatelet agents. ASA                            Grade Assessment: II - A patient with mild systemic                            disease. After reviewing the risks and benefits,                            the patient was deemed in satisfactory condition to                            undergo the procedure.                           After obtaining informed consent, the endoscope was  passed under direct vision. Throughout the                            procedure, the patient's blood pressure, pulse, and                            oxygen saturations were monitored continuously. The                            GIF [id number] #[id number] was introduced through the                            mouth, and advanced to the second part of duodenum.                            The  upper GI endoscopy was accomplished without                            difficulty. The patient tolerated the procedure                            well. Scope In: Scope Out: Findings:                 The examined portions of the nasopharynx,                            oropharynx and larynx were normal.                           The examined esophagus was normal.                           The gastroesophageal flap valve was visualized                            endoscopically and classified as Hill Grade II                            (fold present, opens with respiration).                           Multiple 1 to 6 mm sessile polyps were found in the                            gastric body. The largest polyp was removed with a                            cold snare. Resection and retrieval were complete.                            Estimated blood loss was minimal.                           The exam of the stomach was otherwise normal.  The examined duodenum was normal. Complications:            No immediate complications. Estimated Blood Loss:     Estimated blood loss was minimal. Impression:               - The examined portions of the nasopharynx,                            oropharynx and larynx were normal.                           - Normal esophagus. No evidence of esophagitis or                            Barrett's esophagus.                           - Gastroesophageal flap valve classified as Hill                            Grade II (fold present, opens with respiration).                           - Multiple gastric polyps. One was resected and                            retrieved. These were consistent with fundic gland                            polyps.                           - Normal examined duodenum. Recommendation:           - Patient has a contact number available for                            emergencies. The signs and symptoms of potential                             delayed complications were discussed with the                            patient. Return to normal activities tomorrow.                            Written discharge instructions were provided to the                            patient.                           - Resume previous diet.                           - Continue present medications.                           -  Await pathology results.                           - Recommend trial of famotidine 20 mg PO daily #30                            rf1 for reflux symptoms (PPI relatively                            contraindicated given low bone density) Maddisyn Hegwood E. Stacia, MD 10/21/2024 9:28:19 AM This report has been signed electronically.

## 2024-10-21 NOTE — Progress Notes (Signed)
 Called to room to assist during endoscopic procedure.  Patient ID and intended procedure confirmed with present staff. Received instructions for my participation in the procedure from the performing physician.

## 2024-10-21 NOTE — Progress Notes (Signed)
 Sedate, gd SR, tolerated procedure well, VSS, report to RN

## 2024-10-21 NOTE — Progress Notes (Signed)
 History and Physical Interval Note:  10/21/2024 8:59 AM  Patricia Potts  has presented today for endoscopic procedure(s), with the diagnosis of No diagnosis found..  The various methods of evaluation and treatment have been discussed with the patient and/or family. After consideration of risks, benefits and other options for treatment, the patient has consented to  the endoscopic procedure(s).   The patient's history has been reviewed, patient examined, no change in status, stable for endoscopic procedure(s).  I have reviewed the patient's chart and labs.  Questions were answered to the patient's satisfaction.     Patricia Potts E. Stacia, MD [facility]

## 2024-10-24 ENCOUNTER — Telehealth: Payer: Self-pay

## 2024-10-24 NOTE — Telephone Encounter (Signed)
" °  Follow up Call-     10/21/2024    8:49 AM  Call back number  Post procedure Call Back phone  # [phone]  Permission to leave phone message Yes     Patient questions:  Do you have a fever, pain , or abdominal swelling? No. Pain Score  0 *  Have you tolerated food without any problems? Yes.    Have you been able to return to your normal activities? Yes.    Do you have any questions about your discharge instructions: Diet   No. Medications  No. Follow up visit  No.  Do you have questions or concerns about your Care? No.  Actions: * If pain score is 4 or above: No action needed, pain <4.   "

## 2024-10-25 LAB — SURGICAL PATHOLOGY

## 2024-10-26 ENCOUNTER — Ambulatory Visit: Payer: Self-pay | Admitting: Gastroenterology

## 2024-10-26 NOTE — Progress Notes (Signed)
 Ms. Browder,  The polyp resected from your stomach was a benign fundic gland polyp.  There was no evidence of infection with Helicobacter pylori or intestinal metaplasia/dysplasia.  These types of polyps are very common and typically secondary to acid suppression therapy.  No specific follow-up required for these small, benign polyps. Please take the famotidine  as recommended and follow up with us  in the office in needed if your symptoms are not improving.
